# Patient Record
Sex: Male | Born: 1965 | Race: White | Hispanic: No | Marital: Married | Smoking: Current every day smoker
Health system: Southern US, Community
[De-identification: ages and names within clinical notes are randomized; demographics above are authoritative.]

## PROBLEM LIST (undated history)

## (undated) DIAGNOSIS — G4730 Sleep apnea, unspecified: Secondary | ICD-10-CM

## (undated) DIAGNOSIS — E119 Type 2 diabetes mellitus without complications: Secondary | ICD-10-CM

## (undated) DIAGNOSIS — T7840XA Allergy, unspecified, initial encounter: Secondary | ICD-10-CM

## (undated) DIAGNOSIS — I10 Essential (primary) hypertension: Secondary | ICD-10-CM

## (undated) DIAGNOSIS — M1990 Unspecified osteoarthritis, unspecified site: Secondary | ICD-10-CM

## (undated) DIAGNOSIS — E785 Hyperlipidemia, unspecified: Secondary | ICD-10-CM

## (undated) DIAGNOSIS — J45909 Unspecified asthma, uncomplicated: Secondary | ICD-10-CM

## (undated) HISTORY — DX: Allergy, unspecified, initial encounter: T78.40XA

## (undated) HISTORY — DX: Essential (primary) hypertension: I10

## (undated) HISTORY — PX: WISDOM TOOTH EXTRACTION: SHX21

## (undated) HISTORY — DX: Hyperlipidemia, unspecified: E78.5

## (undated) HISTORY — DX: Unspecified asthma, uncomplicated: J45.909

---

## 2015-05-25 ENCOUNTER — Encounter: Payer: Self-pay | Admitting: Family Medicine

## 2015-05-25 ENCOUNTER — Ambulatory Visit (INDEPENDENT_AMBULATORY_CARE_PROVIDER_SITE_OTHER): Payer: Worker's Compensation | Admitting: Family Medicine

## 2015-05-25 ENCOUNTER — Ambulatory Visit: Payer: Worker's Compensation

## 2015-05-25 VITALS — BP 138/82 | HR 115 | Temp 98.3°F | Resp 16 | Ht 74.0 in | Wt 366.0 lb

## 2015-05-25 DIAGNOSIS — M25572 Pain in left ankle and joints of left foot: Secondary | ICD-10-CM

## 2015-05-25 DIAGNOSIS — S86012A Strain of left Achilles tendon, initial encounter: Secondary | ICD-10-CM | POA: Diagnosis not present

## 2015-05-25 DIAGNOSIS — Y990 Civilian activity done for income or pay: Secondary | ICD-10-CM | POA: Diagnosis not present

## 2015-05-25 MED ORDER — IBUPROFEN 800 MG PO TABS: 800.0000 mg | ORAL_TABLET | Freq: Three times a day (TID) | ORAL | Status: DC

## 2015-05-25 NOTE — Patient Instructions (Signed)
Elevate foot as much as possible  Ice several times daily for the next few days  Ibuprofen 800 mg 3 times daily as needed for pain and inflammation  Wear the boot  Referral is being made to orthopedics. If for some reason you have not gotten in by this weekend please plan to return for a recheck in 5 days

## 2015-05-25 NOTE — Progress Notes (Signed)
Patient ID: [name] [name], male    DOB: 25-May-1966  Age: 49 y.o. MRN: [id number]  Chief Complaint  Patient presents with  . Ankle Pain    Left-Twisted getting out of ambulance today    Subjective:   Patient was at work today, with [employer]. 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 He almost fell but caught himself, twisting his ankle in the process. That was about 8:30 this morning. He lays his boot tightly and continue to work all day. It hurts to bear weight. Although he has had a lot of various injuries and pains in the past, his ankle has been fine and this is a new injury.  Current allergies, medications, problem list, past/family and social histories reviewed.  Objective:  BP 138/82 mmHg  Pulse 115  Temp(Src) 98.3 F (36.8 C) (Oral)  Resp 16  Ht  (1.88 m)  Wt 366 lb (166.017 kg)  BMI 46.97 kg/m2  SpO2 98%  Obese man in no major distress. A little bit of swelling visible on the lateral aspect of the left ankle. No ecchymosis or erythema. He is tender, especially along the Achilles tendon. I cannot feel a definite notch in the tendon. He can plantar flex with moderate pain, but it is severely tender to touch. He has a little tenderness anteriorly but more just anterior to the left lateral malleolus. Some generalized tenderness of the whole ankle joint. Very painful when he tries to walk.  Assessment & Plan:   Assessment: 1. Left ankle pain   2. Strain of Achilles tendon, left, initial encounter   3. Work related injury       Plan: X-ray ankle.  Orders Placed This Encounter  Procedures  . DG Ankle Complete Left    Order Specific Question:  Reason for Exam (SYMPTOM  OR DIAGNOSIS REQUIRED)    Answer:  work injury left ankle and achilles pain    Order Specific Question:  Preferred imaging location?    Answer:  External  . Ambulatory referral to Orthopedic Surgery    Referral Priority:  Urgent   Referral Type:  Surgical    Referral Reason:  Specialty Services Required    Requested Specialty:  Orthopedic Surgery    Number of Visits Requested:  1    Meds ordered this encounter  Medications  . quinapril (ACCUPRIL) 40 MG tablet    Sig: Take 40 mg by mouth daily.  . ropinirole (REQUIP) 5 MG tablet    Sig: Take 5 mg by mouth at bedtime.  . Atorvastatin Calcium (LIPITOR PO)    Sig: Take 1 tablet by mouth daily.  . folic acid (FOLVITE) 1 MG tablet    Sig: Take 1 mg by mouth daily.  . Citalopram Hydrobromide (CELEXA PO)    Sig: Take 1 tablet by mouth daily.  Marland Kitchen albuterol (PROVENTIL HFA;VENTOLIN HFA) 108 (90 BASE) MCG/ACT inhaler    Sig: Inhale 1-2 puffs into the lungs every 6 (six) hours as needed for wheezing or shortness of breath.  . montelukast (SINGULAIR) 10 MG tablet    Sig: Take 10 mg by mouth at bedtime.  Marland Kitchen HYDROCHLOROTHIAZIDE PO    Sig: Take 1 tablet by mouth daily.  . Carvedilol (COREG PO)    Sig: Take 1 tablet by mouth daily.  Marland Kitchen ibuprofen (ADVIL,MOTRIN) 800 MG tablet    Sig: Take 1 tablet (800 mg total) by mouth 3 (three) times daily.  Dispense:  30 tablet    Refill:  0    [facility] reading (PRIMARY) by  Dr. Alwyn Ren Normal x-ray except for some sesamoid bones which look a little prominent.  Will refer to orthopedics. This is either a strain or partial tear of the Achilles..       Patient Instructions  Elevate foot as much as possible  Ice several times daily for the next few days  Ibuprofen 800 mg 3 times daily as needed for pain and inflammation  Wear the boot  Referral is being made to orthopedics. If for some reason you have not gotten in by this weekend please plan to return for a recheck in 5 days     Return in about 5 days (around 05/30/2015).   Arther Heisler, MD 05/25/2015

## 2015-05-28 ENCOUNTER — Telehealth: Payer: Self-pay | Admitting: Radiology

## 2015-05-28 NOTE — Telephone Encounter (Signed)
Spoke with patient and he is unable to come pick up ultram rx as he lives an hour away and says that he will just wait for is ortho appt tomorrow.

## 2015-05-28 NOTE — Telephone Encounter (Signed)
[facility] called [name]([name], he is to see ortho tomorrow, but patient c/o severe pain and has not slept at all, wants to know if we can give him something to help with the pain Ibuprofen not helping

## 2018-03-14 ENCOUNTER — Ambulatory Visit (INDEPENDENT_AMBULATORY_CARE_PROVIDER_SITE_OTHER): Payer: Self-pay

## 2018-03-14 ENCOUNTER — Ambulatory Visit (INDEPENDENT_AMBULATORY_CARE_PROVIDER_SITE_OTHER): Payer: 59 | Admitting: Orthopaedic Surgery

## 2018-03-14 ENCOUNTER — Encounter (INDEPENDENT_AMBULATORY_CARE_PROVIDER_SITE_OTHER): Payer: Self-pay | Admitting: Orthopaedic Surgery

## 2018-03-14 DIAGNOSIS — M1611 Unilateral primary osteoarthritis, right hip: Secondary | ICD-10-CM | POA: Diagnosis not present

## 2018-03-14 DIAGNOSIS — M25551 Pain in right hip: Secondary | ICD-10-CM

## 2018-03-14 NOTE — Progress Notes (Signed)
Office Visit Note   Patient: [name] [name]           Date of Birth: October 23, 1965           MRN: [id number] Visit Date: 03/14/2018              Requested by: Derald Macleod, MD [address] [place], [place] [address] PCP: Patient, No Pcp Per   Assessment & Plan: Visit Diagnoses:  1. Pain in right hip   2. Unilateral primary osteoarthritis, right hip     Plan: The patient does understand that he has significant arthritis of his right hip.  I do feel is warranted to try an intra-articular steroid injection in that hip as does he.  He would like to consider hip replacement surgery but after the first the year.  He has never had any type of injection.  He understands that blood glucose control is important.  He is a smoker as well.  We talked about hip replacement surgery and I showed him a hip model explained in detail what this is surgery involves.  I gave a handout about it as well.  All question concerns were answered and addressed.  We will set him up for an intra-articular steroid injection of the right hip under direct fluoroscopy by Dr. Alvester Morin.  He is interested in this as well.  He understands that he needs to watch his blood glucose closely.  I will see him back in 3 months to see how he is doing overall.  If there is any issues before then he will let us know.  At the next visit I do not need x-rays.  Follow-Up Instructions: Return in about 3 months (around 06/14/2018).   Orders:  Orders Placed This Encounter  Procedures  . XR HIP UNILAT W OR W/O PELVIS 1V RIGHT   No orders of the defined types were placed in this encounter.     Procedures: No procedures performed   Clinical Data: No additional findings.   Subjective: Chief Complaint  Patient presents with  . Right Hip - Pain  Patient is a very pleasant 52 year old gentleman who comes in for evaluation treatment of worsening right hip pain.  He hurts in the groin area for about a year and a half now.  It hurts with  pivoting activities and hurts with putting his shoe and sock on crossing his right leg.  He denies any left hip pain.  Denies any back issues.  He does work in emergency services as a first Photographer.  He is a diabetic but reports good control.  He is recently had labs drawn.  He does not know his hemoglobin A1c.  He reports his daily blood glucose stays below 100 most the time.  He denies any specific injury to his right hip.  He has a older brother who has had multiple joint replacements who is not obese.  He does have a body mass index of 47.  He has lost a lot of weight over time he states.  He still weighs over 300 pounds.  HPI  Review of Systems He currently denies any headache, chest pain, shortness of breath, fever, chills, nausea, vomiting.  Objective: Vital Signs: There were no vitals taken for this visit.  Physical Exam He is alert and oriented x3 in no acute distress.  He walks with a limp.  Examination of the right hip shows significant pain with attempts of internal and external rotation of the right hip with no blocks  to rotation.  His left hip exam is entirely normal.  He does have bilateral knee patellofemoral crepitation. Ortho Exam I did lay him down in the supine position on the exam table so I can get a good idea of what and approach to his hip would be from a surgical standpoint.  I could mobilize his abdomen and find a soft tissue plane to get to his right hip.  His leg lengths were equal. Specialty Comments:  No specialty comments available.  Imaging: Xr Hip Unilat W Or W/o Pelvis 1v Right  Result Date: 03/14/2018 An AP pelvis and lateral the right hip shows significant joint space narrowing of the right hip.  There is  significant degenerative changes in the right hip on plain film.    PMFS History: Patient Active Problem List   Diagnosis Date Noted  . Unilateral primary osteoarthritis, right hip 03/14/2018   Past Medical History:  Diagnosis Date  . Allergy     . Asthma   . Hyperlipidemia   . Hypertension     Family History  Problem Relation Age of Onset  . Heart disease Father   . Hyperlipidemia Father   . Hypertension Father   . Heart disease Brother   . Cancer Sister     History reviewed. No pertinent surgical history. Social History   Occupational History  . Not on file  Tobacco Use  . Smoking status: Current Every Day Smoker    Packs/day: 1.00    Types: Cigarettes  . Smokeless tobacco: Never Used  Substance and Sexual Activity  . Alcohol use: Yes    Alcohol/week: 3.6 oz    Types: 6 Standard drinks or equivalent per week  . Drug use: No  . Sexual activity: Not on file

## 2018-03-16 ENCOUNTER — Other Ambulatory Visit (INDEPENDENT_AMBULATORY_CARE_PROVIDER_SITE_OTHER): Payer: Self-pay

## 2018-03-16 DIAGNOSIS — M25551 Pain in right hip: Secondary | ICD-10-CM

## 2018-04-11 ENCOUNTER — Encounter (INDEPENDENT_AMBULATORY_CARE_PROVIDER_SITE_OTHER): Payer: Self-pay | Admitting: Physical Medicine and Rehabilitation

## 2018-04-11 ENCOUNTER — Ambulatory Visit (INDEPENDENT_AMBULATORY_CARE_PROVIDER_SITE_OTHER): Payer: 59 | Admitting: Physical Medicine and Rehabilitation

## 2018-04-11 ENCOUNTER — Ambulatory Visit (INDEPENDENT_AMBULATORY_CARE_PROVIDER_SITE_OTHER): Payer: Self-pay

## 2018-04-11 DIAGNOSIS — M25551 Pain in right hip: Secondary | ICD-10-CM | POA: Diagnosis not present

## 2018-04-11 NOTE — Progress Notes (Signed)
   Valetta FullerJohn [name] - 52 y.o. male MRN [id number]  Date of birth: [birth year]  Office Visit Note: Visit Date: 04/11/2018 PCP: Patient, No Pcp Per Referred by: No ref. provider found  Subjective: Chief Complaint  Patient presents with  . Right Hip - Pain   HPI: Mr. [name] [name] is a 52 year old gentleman with approximately 1-1/2 years of increasing right hip and groin pain.  He rates his pain as a 6 out of 10.  He comes in today at the request of Dr. Doneen Poissonhristopher Blackman for diagnostic and hopefully therapeutic anesthetic hip arthrogram.   ROS Otherwise per HPI.  Assessment & Plan: Visit Diagnoses:  1. Pain in right hip     Plan: Findings:  Diagnostic note for therapeutic anesthetic hip arthrogram on the right.  Patient did get relief during the anesthetic phase.  Significant osteoarthritis seen on arthrogram.    Meds & Orders: No orders of the defined types were placed in this encounter.   Orders Placed This Encounter  Procedures  . Large Joint Inj: R hip joint  . XR C-ARM NO REPORT    Follow-up: Return if symptoms worsen or fail to improve.   Procedures: Large Joint Inj: R hip joint on 04/11/2018 10:46 AM Indications: pain and diagnostic evaluation Details: 22 G needle, anterior approach  Arthrogram: Yes  Medications: 80 mg triamcinolone acetonide 40 MG/ML; 3 mL bupivacaine 0.5 % Outcome: tolerated well, no immediate complications  Arthrogram demonstrated excellent flow of contrast throughout the joint surface without extravasation or obvious defect.  The patient had relief of symptoms during the anesthetic phase of the injection.  Procedure, treatment alternatives, risks and benefits explained, specific risks discussed. Consent was given by the patient. Immediately prior to procedure a time out was called to verify the correct patient, procedure, equipment, support staff and site/side marked as required. Patient was prepped and draped in the usual sterile fashion.      No  notes on file   Clinical History: No specialty comments available.   He reports that he has been smoking cigarettes. He has been smoking about 1.00 pack per day. He has never used smokeless tobacco. No results for input(s): HGBA1C, LABURIC in the last 8760 hours.  Objective:  VS:  HT:    WT:   BMI:     BP:   HR: bpm  TEMP: ( )  RESP:  Physical Exam  Ortho Exam Imaging: No results found.  Past Medical/Family/Surgical/Social History: Medications & Allergies reviewed per EMR, new medications updated. Patient Active Problem List   Diagnosis Date Noted  . Unilateral primary osteoarthritis, right hip 03/14/2018   Past Medical History:  Diagnosis Date  . Allergy   . Asthma   . Hyperlipidemia   . Hypertension    Family History  Problem Relation Age of Onset  . Heart disease Father   . Hyperlipidemia Father   . Hypertension Father   . Heart disease Brother   . Cancer Sister    History reviewed. No pertinent surgical history. Social History   Occupational History  . Not on file  Tobacco Use  . Smoking status: Current Every Day Smoker    Packs/day: 1.00    Types: Cigarettes  . Smokeless tobacco: Never Used  Substance and Sexual Activity  . Alcohol use: Yes    Alcohol/week: 6.0 standard drinks    Types: 6 Standard drinks or equivalent per week  . Drug use: No  . Sexual activity: Not on file

## 2018-04-11 NOTE — Progress Notes (Signed)
  Numeric Pain Rating Scale and Functional Assessment Average Pain 6   In the last MONTH (on 0-10 scale) has pain interfered with the following?  1. General activity like being  able to carry out your everyday physical activities such as walking, climbing stairs, carrying groceries, or moving a chair?  Rating(9)    -Dye Allergies.

## 2018-04-11 NOTE — Patient Instructions (Signed)

## 2018-04-24 MED ORDER — BUPIVACAINE HCL 0.5 % IJ SOLN
3.0000 mL | INTRAMUSCULAR | Status: AC | PRN
  Administered 2018-04-11: 3 mL via INTRA_ARTICULAR

## 2018-04-24 MED ORDER — TRIAMCINOLONE ACETONIDE 40 MG/ML IJ SUSP
80.0000 mg | INTRAMUSCULAR | Status: AC | PRN
  Administered 2018-04-11: 80 mg via INTRA_ARTICULAR

## 2018-06-11 ENCOUNTER — Ambulatory Visit (INDEPENDENT_AMBULATORY_CARE_PROVIDER_SITE_OTHER): Payer: 59 | Admitting: Orthopaedic Surgery

## 2018-06-11 ENCOUNTER — Encounter (INDEPENDENT_AMBULATORY_CARE_PROVIDER_SITE_OTHER): Payer: Self-pay | Admitting: Orthopaedic Surgery

## 2018-06-11 DIAGNOSIS — M1611 Unilateral primary osteoarthritis, right hip: Secondary | ICD-10-CM

## 2018-06-11 DIAGNOSIS — M25551 Pain in right hip: Secondary | ICD-10-CM | POA: Diagnosis not present

## 2018-06-11 NOTE — Progress Notes (Signed)
Patient is a morbidly obese 52 year old gentleman with diabetes well-known to me.  He is blood glucose is been under excellent control with his last A1c 6.  He does have a BMI of 47.  He has severe arthritis involving his right hip.  He had an intra-articular injection recently and that helped temporize his pain.  He still walks with a significant limp.  On exam he still has severe pain with internal extra rotation of the right hip with x-rays that are well-documented showing the severe arthritis in his right hip.  I did lay him in a supine position on the exam table and he does have a large abdomen that I can mobilize along his thigh but I feel comfortable from a surgical standpoint getting to his hip.  A long thorough discussion about hip ablation surgery again like we did at his last visit.  He understands the difficulty of the surgery given his obesity and the high risk of blood loss fracture dislocation and especially implant failure.  He is willing to take these under consideration.  I am willing to perform this surgery in spite of the risks.  He would like to have this set up for sometime the second week of January.  All question concerns were answered and addressed.  He understands if he has questionable pulling to not hesitate to give me a call.

## 2018-07-26 ENCOUNTER — Other Ambulatory Visit (INDEPENDENT_AMBULATORY_CARE_PROVIDER_SITE_OTHER): Payer: Self-pay

## 2018-07-30 ENCOUNTER — Other Ambulatory Visit (INDEPENDENT_AMBULATORY_CARE_PROVIDER_SITE_OTHER): Payer: Self-pay | Admitting: Physician Assistant

## 2018-07-31 NOTE — Pre-Procedure Instructions (Signed)
Valetta FullerJohn Heaslip  07/31/2018      [id number] - [place], [place] - [address] [place] [id number] Phone: [phone] Fax: [phone]    Your procedure is scheduled on December 24  [facility] at 0745 A.M.  Call this number if you have problems the morning of surgery:  [phone]   Remember:  Do not eat or drink after midnight.      Take these medicines the morning of surgery with A SIP OF WATER  acetaminophen (TYLENOL)  albuterol (PROVENTIL HFA;VENTOLIN HFA)  carvedilol (COREG) montelukast (SINGULAIR)  7 days prior to surgery STOP taking any Aspirin(unless otherwise instructed by your surgeon), Aleve, Naproxen, Ibuprofen, Motrin, Advil, Goody's, BC's, all herbal medications, fish oil, and all vitamins   WHAT DO I DO ABOUT MY DIABETES MEDICATION?   Marland Kitchen. Do not take oral diabetes medicines (pills) the morning of surgery. metFORMIN (GLUCOPHAGE-XR)   How to Manage Your Diabetes Before and After Surgery  Why is it important to control my blood sugar before and after surgery? . Improving blood sugar levels before and after surgery helps healing and can limit problems. . A way of improving blood sugar control is eating a healthy diet by: o  Eating less sugar and carbohydrates o  Increasing activity/exercise o  Talking with your doctor about reaching your blood sugar goals . High blood sugars (greater than 180 mg/dL) can raise your risk of infections and slow your recovery, so you will need to focus on controlling your diabetes during the weeks before surgery. . Make sure that the doctor who takes care of your diabetes knows about your planned surgery including the date and location.  How do I manage my blood sugar before surgery? . Check your blood sugar at least 4 times a day, starting 2 days before surgery, to make sure that the level is not too high or low. o Check your blood sugar the morning of your surgery  when you wake up and every 2 hours until you get to the Short Stay unit. . If your blood sugar is less than 70 mg/dL, you will need to treat for low blood sugar: o Do not take insulin. o Treat a low blood sugar (less than 70 mg/dL) with  cup of clear juice (cranberry or apple), 4 glucose tablets, OR glucose gel. o Recheck blood sugar in 15 minutes after treatment (to make sure it is greater than 70 mg/dL). If your blood sugar is not greater than 70 mg/dL on recheck, call [phone] for further instructions. . Report your blood sugar to the short stay nurse when you get to Short Stay.  . If you are admitted to the hospital after surgery: o Your blood sugar will be checked by the staff and you will probably be given insulin after surgery (instead of oral diabetes medicines) to make sure you have good blood sugar levels. o The goal for blood sugar control after surgery is 80-180 mg/dL.     Do not wear jewelry  Do not wear lotions, powders, or cologne, or deodorant.  Men may shave face and neck.  Do not bring valuables to the hospital.  [facility] is not responsible for any belongings or valuables.  Contacts, dentures or bridgework may not be worn into surgery.  Leave your suitcase in the car.  After surgery it may be brought to your room.  For patients admitted to the hospital, discharge time  will be determined by your treatment team.  Patients discharged the day of surgery will not be allowed to drive home.    Special instructions:   [place]- Preparing For Surgery  Before surgery, you can play an important role. Because skin is not sterile, your skin needs to be as free of germs as possible. You can reduce the number of germs on your skin by washing with CHG (chlorahexidine gluconate) Soap before surgery.  CHG is an antiseptic cleaner which kills germs and bonds with the skin to continue killing germs even after washing.    Oral Hygiene is also important to reduce your risk of  infection.  Remember - BRUSH YOUR TEETH THE MORNING OF SURGERY WITH YOUR REGULAR TOOTHPASTE  Please do not use if you have an allergy to CHG or antibacterial soaps. If your skin becomes reddened/irritated stop using the CHG.  Do not shave (including legs and underarms) for at least 48 hours prior to first CHG shower. It is OK to shave your face.  Please follow these instructions carefully.   1. Shower the NIGHT BEFORE SURGERY and the MORNING OF SURGERY with CHG.   2. If you chose to wash your hair, wash your hair first as usual with your normal shampoo.  3. After you shampoo, rinse your hair and body thoroughly to remove the shampoo.  4. Use CHG as you would any other liquid soap. You can apply CHG directly to the skin and wash gently with a scrungie or a clean washcloth.   5. Apply the CHG Soap to your body ONLY FROM THE NECK DOWN.  Do not use on open wounds or open sores. Avoid contact with your eyes, ears, mouth and genitals (private parts). Wash Face and genitals (private parts)  with your normal soap.  6. Wash thoroughly, paying special attention to the area where your surgery will be performed.  7. Thoroughly rinse your body with warm water from the neck down.  8. DO NOT shower/wash with your normal soap after using and rinsing off the CHG Soap.  9. Pat yourself dry with a CLEAN TOWEL.  10. Wear CLEAN PAJAMAS to bed the night before surgery, wear comfortable clothes the morning of surgery  11. Place CLEAN SHEETS on your bed the night of your first shower and DO NOT SLEEP WITH PETS.    Day of Surgery:  Do not apply any deodorants/lotions.  Please wear clean clothes to the hospital/surgery center.   Remember to brush your teeth WITH YOUR REGULAR TOOTHPASTE.    Please read over the following fact sheets that you were given.

## 2018-08-01 ENCOUNTER — Encounter (HOSPITAL_COMMUNITY): Payer: Self-pay

## 2018-08-01 ENCOUNTER — Encounter (HOSPITAL_COMMUNITY)
Admission: RE | Admit: 2018-08-01 | Discharge: 2018-08-01 | Disposition: A | Discharge status: Home / Self Care | Payer: 59 | Source: Ambulatory Visit | Attending: Orthopaedic Surgery | Admitting: Orthopaedic Surgery

## 2018-08-01 ENCOUNTER — Other Ambulatory Visit: Payer: Self-pay

## 2018-08-01 DIAGNOSIS — Z01818 Encounter for other preprocedural examination: Secondary | ICD-10-CM | POA: Diagnosis present

## 2018-08-01 DIAGNOSIS — I10 Essential (primary) hypertension: Secondary | ICD-10-CM | POA: Insufficient documentation

## 2018-08-01 HISTORY — DX: Type 2 diabetes mellitus without complications: E11.9

## 2018-08-01 HISTORY — DX: Sleep apnea, unspecified: G47.30

## 2018-08-01 LAB — COMPREHENSIVE METABOLIC PANEL
ALT: 41 U/L (ref 0–44)
AST: 33 U/L (ref 15–41)
Albumin: 4.2 g/dL (ref 3.5–5.0)
Alkaline Phosphatase: 50 U/L (ref 38–126)
Anion gap: 14 (ref 5–15)
BUN: 16 mg/dL (ref 6–20)
CHLORIDE: 103 mmol/L (ref 98–111)
CO2: 21 mmol/L — ABNORMAL LOW (ref 22–32)
Calcium: 9.6 mg/dL (ref 8.9–10.3)
Creatinine, Ser: 0.88 mg/dL (ref 0.61–1.24)
GFR calc Af Amer: >60 mL/min (ref >60)
Glucose, Bld: 101 mg/dL — ABNORMAL HIGH (ref 70–99)
Potassium: 4.2 mmol/L (ref 3.5–5.1)
Sodium: 138 mmol/L (ref 135–145)
Total Bilirubin: 0.7 mg/dL (ref 0.3–1.2)
Total Protein: 7.9 g/dL (ref 6.5–8.1)

## 2018-08-01 LAB — CBC
HCT: 45.8 % (ref 39.0–52.0)
Hemoglobin: 15.6 g/dL (ref 13.0–17.0)
MCH: 34.4 pg — AB (ref 26.0–34.0)
MCHC: 34.1 g/dL (ref 30.0–36.0)
MCV: 100.9 fL — AB (ref 80.0–100.0)
NRBC: 0 % (ref 0.0–0.2)
Platelets: 198 10*3/uL (ref 150–400)
RBC: 4.54 MIL/uL (ref 4.22–5.81)
RDW: 12.9 % (ref 11.5–15.5)
WBC: 10.3 10*3/uL (ref 4.0–10.5)

## 2018-08-01 LAB — GLUCOSE, CAPILLARY: Glucose-Capillary: 104 mg/dL — ABNORMAL HIGH (ref 70–99)

## 2018-08-01 NOTE — Progress Notes (Signed)
[facility] - saw one over a year ago but doesn't remember the name or where he went  Chest x-ray - N/A EKG - 08/01/18 Stress Test - 2017 ECHO - 2016  Sleep Study - ~ 2013 CPAP - will bring mask and hose  Fasting Blood Sugar - low 100s Checks Blood Sugar 2-3 times per week  Blood Thinner Instructions: N/A Aspirin Instructions: last dose 12/18  Anesthesia review: none  Patient denies shortness of breath, fever, cough and chest pain at PAT appointment   Patient verbalized understanding of instructions that were given to them at the PAT appointment. Patient was also instructed that they will need to review over the PAT instructions again at home before surgery.

## 2018-08-06 MED ORDER — DEXTROSE 5 % IV SOLN
3.0000 g | INTRAVENOUS | Status: AC
  Administered 2018-08-07: 3 g via INTRAVENOUS
  Filled 2018-08-06: qty 3

## 2018-08-06 MED ORDER — TRANEXAMIC ACID-NACL 1000-0.7 MG/100ML-% IV SOLN
1000.0000 mg | INTRAVENOUS | Status: AC
  Administered 2018-08-07: 1000 mg via INTRAVENOUS
  Filled 2018-08-06: qty 100

## 2018-08-07 ENCOUNTER — Encounter (HOSPITAL_COMMUNITY): Payer: Self-pay | Admitting: Registered Nurse

## 2018-08-07 ENCOUNTER — Inpatient Hospital Stay (HOSPITAL_COMMUNITY): Payer: 59 | Admitting: Registered Nurse

## 2018-08-07 ENCOUNTER — Inpatient Hospital Stay (HOSPITAL_COMMUNITY): Payer: 59

## 2018-08-07 ENCOUNTER — Encounter (HOSPITAL_COMMUNITY)
Admission: RE | Disposition: A | Discharge status: Home Health | Payer: Self-pay | Source: Home / Self Care | Attending: Orthopaedic Surgery

## 2018-08-07 ENCOUNTER — Other Ambulatory Visit: Payer: Self-pay

## 2018-08-07 ENCOUNTER — Inpatient Hospital Stay (HOSPITAL_COMMUNITY)
Admission: RE | Admit: 2018-08-07 | Discharge: 2018-08-08 | DRG: 470 | Disposition: A | Discharge status: Home Health | Payer: 59 | Attending: Orthopaedic Surgery | Admitting: Orthopaedic Surgery

## 2018-08-07 DIAGNOSIS — Z7982 Long term (current) use of aspirin: Secondary | ICD-10-CM | POA: Diagnosis not present

## 2018-08-07 DIAGNOSIS — M1611 Unilateral primary osteoarthritis, right hip: Secondary | ICD-10-CM | POA: Diagnosis present

## 2018-08-07 DIAGNOSIS — Z8249 Family history of ischemic heart disease and other diseases of the circulatory system: Secondary | ICD-10-CM

## 2018-08-07 DIAGNOSIS — Z79899 Other long term (current) drug therapy: Secondary | ICD-10-CM | POA: Diagnosis not present

## 2018-08-07 DIAGNOSIS — Z885 Allergy status to narcotic agent status: Secondary | ICD-10-CM

## 2018-08-07 DIAGNOSIS — F17210 Nicotine dependence, cigarettes, uncomplicated: Secondary | ICD-10-CM | POA: Diagnosis present

## 2018-08-07 DIAGNOSIS — Z8349 Family history of other endocrine, nutritional and metabolic diseases: Secondary | ICD-10-CM | POA: Diagnosis not present

## 2018-08-07 DIAGNOSIS — Z7984 Long term (current) use of oral hypoglycemic drugs: Secondary | ICD-10-CM | POA: Diagnosis not present

## 2018-08-07 DIAGNOSIS — Z419 Encounter for procedure for purposes other than remedying health state, unspecified: Secondary | ICD-10-CM

## 2018-08-07 DIAGNOSIS — E119 Type 2 diabetes mellitus without complications: Secondary | ICD-10-CM | POA: Diagnosis present

## 2018-08-07 DIAGNOSIS — G4730 Sleep apnea, unspecified: Secondary | ICD-10-CM | POA: Diagnosis present

## 2018-08-07 DIAGNOSIS — Z6841 Body Mass Index (BMI) 40.0 and over, adult: Secondary | ICD-10-CM | POA: Diagnosis not present

## 2018-08-07 DIAGNOSIS — E785 Hyperlipidemia, unspecified: Secondary | ICD-10-CM | POA: Diagnosis present

## 2018-08-07 DIAGNOSIS — Z96642 Presence of left artificial hip joint: Secondary | ICD-10-CM

## 2018-08-07 DIAGNOSIS — Z888 Allergy status to other drugs, medicaments and biological substances status: Secondary | ICD-10-CM

## 2018-08-07 DIAGNOSIS — I10 Essential (primary) hypertension: Secondary | ICD-10-CM | POA: Diagnosis present

## 2018-08-07 DIAGNOSIS — Z96641 Presence of right artificial hip joint: Secondary | ICD-10-CM

## 2018-08-07 HISTORY — PX: TOTAL HIP ARTHROPLASTY: SHX124

## 2018-08-07 LAB — GLUCOSE, CAPILLARY
Glucose-Capillary: 116 mg/dL — ABNORMAL HIGH (ref 70–99)
Glucose-Capillary: 104 mg/dL — ABNORMAL HIGH (ref 70–99)
Glucose-Capillary: 338 mg/dL — ABNORMAL HIGH (ref 70–99)
Glucose-Capillary: 165 mg/dL — ABNORMAL HIGH (ref 70–99)

## 2018-08-07 SURGERY — ARTHROPLASTY, HIP, TOTAL, ANTERIOR APPROACH
Anesthesia: Spinal | Site: Hip | Laterality: Right

## 2018-08-07 MED ORDER — ONDANSETRON HCL 4 MG PO TABS: 4.0000 mg | ORAL_TABLET | Freq: Four times a day (QID) | ORAL | Status: DC | PRN

## 2018-08-07 MED ORDER — METFORMIN HCL ER 500 MG PO TB24
1000.0000 mg | ORAL_TABLET | Freq: Two times a day (BID) | ORAL | Status: DC
  Administered 2018-08-07 – 2018-08-08 (×2): 1000 mg via ORAL
  Filled 2018-08-07 (×2): qty 2

## 2018-08-07 MED ORDER — PROPOFOL 1000 MG/100ML IV EMUL
INTRAVENOUS | Status: AC
  Filled 2018-08-07: qty 100

## 2018-08-07 MED ORDER — CARVEDILOL 12.5 MG PO TABS
12.5000 mg | ORAL_TABLET | Freq: Two times a day (BID) | ORAL | Status: DC
  Administered 2018-08-07 – 2018-08-08 (×2): 12.5 mg via ORAL
  Filled 2018-08-07 (×2): qty 1

## 2018-08-07 MED ORDER — ALBUTEROL SULFATE (2.5 MG/3ML) 0.083% IN NEBU: 2.5000 mg | INHALATION_SOLUTION | Freq: Four times a day (QID) | RESPIRATORY_TRACT | Status: DC | PRN

## 2018-08-07 MED ORDER — SODIUM CHLORIDE 0.9 % IR SOLN
Status: DC | PRN
  Administered 2018-08-07: 3000 mL

## 2018-08-07 MED ORDER — OXYCODONE HCL 5 MG PO TABS: 5.0000 mg | ORAL_TABLET | ORAL | Status: DC | PRN

## 2018-08-07 MED ORDER — ASPIRIN 81 MG PO CHEW
81.0000 mg | CHEWABLE_TABLET | Freq: Two times a day (BID) | ORAL | Status: DC
  Administered 2018-08-07 – 2018-08-08 (×2): 81 mg via ORAL
  Filled 2018-08-07 (×2): qty 1

## 2018-08-07 MED ORDER — PHENOL 1.4 % MT LIQD: 1.0000 | OROMUCOSAL | Status: DC | PRN

## 2018-08-07 MED ORDER — MIDAZOLAM HCL 2 MG/2ML IJ SOLN
INTRAMUSCULAR | Status: AC
  Filled 2018-08-07: qty 2

## 2018-08-07 MED ORDER — ALBUTEROL SULFATE HFA 108 (90 BASE) MCG/ACT IN AERS: 1.0000 | INHALATION_SPRAY | Freq: Four times a day (QID) | RESPIRATORY_TRACT | Status: DC | PRN

## 2018-08-07 MED ORDER — METOCLOPRAMIDE HCL 5 MG PO TABS: 5.0000 mg | ORAL_TABLET | Freq: Three times a day (TID) | ORAL | Status: DC | PRN

## 2018-08-07 MED ORDER — DEXAMETHASONE SODIUM PHOSPHATE 10 MG/ML IJ SOLN
INTRAMUSCULAR | Status: AC
  Filled 2018-08-07: qty 1

## 2018-08-07 MED ORDER — KETOROLAC TROMETHAMINE 15 MG/ML IJ SOLN
15.0000 mg | Freq: Four times a day (QID) | INTRAMUSCULAR | Status: DC
  Administered 2018-08-07 – 2018-08-08 (×3): 15 mg via INTRAVENOUS
  Filled 2018-08-07 (×4): qty 1

## 2018-08-07 MED ORDER — DIPHENHYDRAMINE HCL 12.5 MG/5ML PO ELIX: 12.5000 mg | ORAL_SOLUTION | ORAL | Status: DC | PRN

## 2018-08-07 MED ORDER — SODIUM CHLORIDE 0.9 % IV SOLN
INTRAVENOUS | Status: DC | PRN
  Administered 2018-08-07: 30 ug/min via INTRAVENOUS

## 2018-08-07 MED ORDER — OXYCODONE HCL 5 MG PO TABS
10.0000 mg | ORAL_TABLET | ORAL | Status: DC | PRN
  Administered 2018-08-07: 10 mg via ORAL
  Administered 2018-08-08: 15 mg via ORAL
  Filled 2018-08-07: qty 3
  Filled 2018-08-07: qty 2

## 2018-08-07 MED ORDER — QUINAPRIL HCL 10 MG PO TABS
20.0000 mg | ORAL_TABLET | Freq: Every day | ORAL | Status: DC
  Administered 2018-08-07 – 2018-08-08 (×2): 20 mg via ORAL
  Filled 2018-08-07 (×2): qty 2

## 2018-08-07 MED ORDER — METOCLOPRAMIDE HCL 5 MG/ML IJ SOLN: 5.0000 mg | Freq: Three times a day (TID) | INTRAMUSCULAR | Status: DC | PRN

## 2018-08-07 MED ORDER — MENTHOL 3 MG MT LOZG: 1.0000 | LOZENGE | OROMUCOSAL | Status: DC | PRN

## 2018-08-07 MED ORDER — LACTATED RINGERS IV SOLN
INTRAVENOUS | Status: DC | PRN
  Administered 2018-08-07 (×2): via INTRAVENOUS

## 2018-08-07 MED ORDER — ALUM & MAG HYDROXIDE-SIMETH 200-200-20 MG/5ML PO SUSP: 30.0000 mL | ORAL | Status: DC | PRN

## 2018-08-07 MED ORDER — DEXAMETHASONE SODIUM PHOSPHATE 10 MG/ML IJ SOLN
INTRAMUSCULAR | Status: DC | PRN
  Administered 2018-08-07: 4 mg via INTRAVENOUS

## 2018-08-07 MED ORDER — PROPOFOL 10 MG/ML IV BOLUS
INTRAVENOUS | Status: DC | PRN
  Administered 2018-08-07 (×2): 20 mg via INTRAVENOUS
  Administered 2018-08-07: 50 mg via INTRAVENOUS

## 2018-08-07 MED ORDER — HYDROMORPHONE HCL 1 MG/ML IJ SOLN: 0.2500 mg | INTRAMUSCULAR | Status: DC | PRN

## 2018-08-07 MED ORDER — INSULIN ASPART 100 UNIT/ML ~~LOC~~ SOLN
0.0000 [IU] | Freq: Three times a day (TID) | SUBCUTANEOUS | Status: DC
  Administered 2018-08-07: 15 [IU] via SUBCUTANEOUS

## 2018-08-07 MED ORDER — 0.9 % SODIUM CHLORIDE (POUR BTL) OPTIME
TOPICAL | Status: DC | PRN
  Administered 2018-08-07: 1000 mL

## 2018-08-07 MED ORDER — HYDROMORPHONE HCL 1 MG/ML IJ SOLN: 0.5000 mg | INTRAMUSCULAR | Status: DC | PRN

## 2018-08-07 MED ORDER — NICOTINE 21 MG/24HR TD PT24
21.0000 mg | MEDICATED_PATCH | Freq: Every day | TRANSDERMAL | Status: DC
  Administered 2018-08-07 – 2018-08-08 (×2): 21 mg via TRANSDERMAL
  Filled 2018-08-07 (×2): qty 1

## 2018-08-07 MED ORDER — ONDANSETRON HCL 4 MG/2ML IJ SOLN: 4.0000 mg | Freq: Four times a day (QID) | INTRAMUSCULAR | Status: DC | PRN

## 2018-08-07 MED ORDER — METHOCARBAMOL 500 MG PO TABS
500.0000 mg | ORAL_TABLET | Freq: Four times a day (QID) | ORAL | Status: DC | PRN
  Administered 2018-08-08: 500 mg via ORAL
  Filled 2018-08-07: qty 1

## 2018-08-07 MED ORDER — EPHEDRINE 5 MG/ML INJ
INTRAVENOUS | Status: AC
  Filled 2018-08-07: qty 10

## 2018-08-07 MED ORDER — INSULIN ASPART 100 UNIT/ML ~~LOC~~ SOLN: 0.0000 [IU] | Freq: Every day | SUBCUTANEOUS | Status: DC

## 2018-08-07 MED ORDER — FENTANYL CITRATE (PF) 250 MCG/5ML IJ SOLN
INTRAMUSCULAR | Status: AC
  Filled 2018-08-07: qty 5

## 2018-08-07 MED ORDER — POLYETHYLENE GLYCOL 3350 17 G PO PACK: 17.0000 g | PACK | Freq: Every day | ORAL | Status: DC | PRN

## 2018-08-07 MED ORDER — PANTOPRAZOLE SODIUM 40 MG PO TBEC
40.0000 mg | DELAYED_RELEASE_TABLET | Freq: Every day | ORAL | Status: DC
  Administered 2018-08-07 – 2018-08-08 (×2): 40 mg via ORAL
  Filled 2018-08-07 (×2): qty 1

## 2018-08-07 MED ORDER — PROPOFOL 500 MG/50ML IV EMUL
INTRAVENOUS | Status: DC | PRN
  Administered 2018-08-07: 100 ug/kg/min via INTRAVENOUS
  Administered 2018-08-07: 11:00:00 via INTRAVENOUS

## 2018-08-07 MED ORDER — ZOLPIDEM TARTRATE 5 MG PO TABS
5.0000 mg | ORAL_TABLET | Freq: Every evening | ORAL | Status: DC | PRN
  Administered 2018-08-07: 5 mg via ORAL
  Filled 2018-08-07: qty 1

## 2018-08-07 MED ORDER — METHOCARBAMOL 1000 MG/10ML IJ SOLN
500.0000 mg | Freq: Four times a day (QID) | INTRAVENOUS | Status: DC | PRN
  Filled 2018-08-07: qty 5

## 2018-08-07 MED ORDER — BUPIVACAINE IN DEXTROSE 0.75-8.25 % IT SOLN
INTRATHECAL | Status: DC | PRN
  Administered 2018-08-07: 2 mL via INTRATHECAL

## 2018-08-07 MED ORDER — CITALOPRAM HYDROBROMIDE 40 MG PO TABS
40.0000 mg | ORAL_TABLET | Freq: Every evening | ORAL | Status: DC
  Administered 2018-08-07: 40 mg via ORAL
  Filled 2018-08-07: qty 1

## 2018-08-07 MED ORDER — ONDANSETRON HCL 4 MG/2ML IJ SOLN
INTRAMUSCULAR | Status: AC
  Filled 2018-08-07: qty 2

## 2018-08-07 MED ORDER — DOCUSATE SODIUM 100 MG PO CAPS
100.0000 mg | ORAL_CAPSULE | Freq: Two times a day (BID) | ORAL | Status: DC
  Administered 2018-08-07 – 2018-08-08 (×3): 100 mg via ORAL
  Filled 2018-08-07 (×3): qty 1

## 2018-08-07 MED ORDER — CEFAZOLIN SODIUM-DEXTROSE 2-4 GM/100ML-% IV SOLN
2.0000 g | Freq: Four times a day (QID) | INTRAVENOUS | Status: AC
  Administered 2018-08-07 (×2): 2 g via INTRAVENOUS
  Filled 2018-08-07 (×2): qty 100

## 2018-08-07 MED ORDER — CHLORHEXIDINE GLUCONATE 4 % EX LIQD: 60.0000 mL | Freq: Once | CUTANEOUS | Status: DC

## 2018-08-07 MED ORDER — MONTELUKAST SODIUM 10 MG PO TABS
10.0000 mg | ORAL_TABLET | Freq: Every day | ORAL | Status: DC
  Administered 2018-08-08: 10 mg via ORAL
  Filled 2018-08-07 (×2): qty 1

## 2018-08-07 MED ORDER — PROMETHAZINE HCL 25 MG/ML IJ SOLN: 6.2500 mg | INTRAMUSCULAR | Status: DC | PRN

## 2018-08-07 MED ORDER — HYDROCHLOROTHIAZIDE 25 MG PO TABS
25.0000 mg | ORAL_TABLET | Freq: Every day | ORAL | Status: DC
  Administered 2018-08-07 – 2018-08-08 (×2): 25 mg via ORAL
  Filled 2018-08-07 (×2): qty 1

## 2018-08-07 MED ORDER — SODIUM CHLORIDE 0.9 % IV SOLN
INTRAVENOUS | Status: DC
  Administered 2018-08-07: 15:00:00 via INTRAVENOUS

## 2018-08-07 MED ORDER — FOLIC ACID 1 MG PO TABS
1.0000 mg | ORAL_TABLET | Freq: Every day | ORAL | Status: DC
  Administered 2018-08-07 – 2018-08-08 (×2): 1 mg via ORAL
  Filled 2018-08-07 (×2): qty 1

## 2018-08-07 MED ORDER — MIDAZOLAM HCL 5 MG/5ML IJ SOLN
INTRAMUSCULAR | Status: DC | PRN
  Administered 2018-08-07: 2 mg via INTRAVENOUS

## 2018-08-07 MED ORDER — PHENYLEPHRINE 40 MCG/ML (10ML) SYRINGE FOR IV PUSH (FOR BLOOD PRESSURE SUPPORT)
PREFILLED_SYRINGE | INTRAVENOUS | Status: AC
  Filled 2018-08-07: qty 10

## 2018-08-07 MED ORDER — PROPOFOL 10 MG/ML IV BOLUS
INTRAVENOUS | Status: AC
  Filled 2018-08-07: qty 20

## 2018-08-07 MED ORDER — FENTANYL CITRATE (PF) 250 MCG/5ML IJ SOLN
INTRAMUSCULAR | Status: DC | PRN
  Administered 2018-08-07 (×2): 50 ug via INTRAVENOUS

## 2018-08-07 MED ORDER — ONDANSETRON HCL 4 MG/2ML IJ SOLN
INTRAMUSCULAR | Status: DC | PRN
  Administered 2018-08-07: 4 mg via INTRAVENOUS

## 2018-08-07 MED ORDER — ACETAMINOPHEN 325 MG PO TABS: 325.0000 mg | ORAL_TABLET | Freq: Four times a day (QID) | ORAL | Status: DC | PRN

## 2018-08-07 MED ORDER — LACTATED RINGERS IV SOLN: INTRAVENOUS | Status: DC

## 2018-08-07 SURGICAL SUPPLY — 54 items
ACETAB CUP W GRIPTION 54MM (Plate) ×1 IMPLANT
ACETAB CUP W/GRIPTION 54 (Plate) ×2 IMPLANT
BENZOIN TINCTURE PRP APPL 2/3 (GAUZE/BANDAGES/DRESSINGS) ×3 IMPLANT
BLADE SAW SGTL 18X1.27X75 (BLADE) ×2 IMPLANT
BLADE SAW SGTL 18X1.27X75MM (BLADE) ×1
COVER SURGICAL LIGHT HANDLE (MISCELLANEOUS) ×3 IMPLANT
CUP ACETAB W/GRIPTION 54 (Plate) ×1 IMPLANT
DRAPE C-ARM 42X72 X-RAY (DRAPES) ×3 IMPLANT
DRAPE STERI IOBAN 125X83 (DRAPES) ×3 IMPLANT
DRAPE U-SHAPE 47X51 STRL (DRAPES) ×9 IMPLANT
DRSG AQUACEL AG ADV 3.5X 6 (GAUZE/BANDAGES/DRESSINGS) ×3 IMPLANT
DRSG AQUACEL AG ADV 3.5X10 (GAUZE/BANDAGES/DRESSINGS) ×3 IMPLANT
DURAPREP 26ML APPLICATOR (WOUND CARE) ×3 IMPLANT
ELECT BLADE 4.0 EZ CLEAN MEGAD (MISCELLANEOUS) ×3
ELECT BLADE 6.5 EXT (BLADE) IMPLANT
ELECT REM PT RETURN 9FT ADLT (ELECTROSURGICAL) ×3
ELECTRODE BLDE 4.0 EZ CLN MEGD (MISCELLANEOUS) ×1 IMPLANT
ELECTRODE REM PT RTRN 9FT ADLT (ELECTROSURGICAL) ×1 IMPLANT
FACESHIELD WRAPAROUND (MASK) ×6 IMPLANT
GAUZE XEROFORM 1X8 LF (GAUZE/BANDAGES/DRESSINGS) ×3 IMPLANT
GLOVE BIOGEL PI IND STRL 8 (GLOVE) ×2 IMPLANT
GLOVE BIOGEL PI INDICATOR 8 (GLOVE) ×4
GLOVE ECLIPSE 7.0 STRL STRAW (GLOVE) ×6 IMPLANT
GLOVE ORTHO TXT STRL SZ7.5 (GLOVE) ×6 IMPLANT
GLOVE SURG SS PI 6.5 STRL IVOR (GLOVE) ×3 IMPLANT
GLOVE SURG SS PI 7.0 STRL IVOR (GLOVE) ×3 IMPLANT
GOWN STRL REUS W/ TWL LRG LVL3 (GOWN DISPOSABLE) ×2 IMPLANT
GOWN STRL REUS W/ TWL XL LVL3 (GOWN DISPOSABLE) ×2 IMPLANT
GOWN STRL REUS W/TWL LRG LVL3 (GOWN DISPOSABLE) ×4
GOWN STRL REUS W/TWL XL LVL3 (GOWN DISPOSABLE) ×4
HANDPIECE INTERPULSE COAX TIP (DISPOSABLE) ×2
HEAD CERAMIC DELTA 36 PLUS 1.5 (Hips) ×3 IMPLANT
KIT BASIN OR (CUSTOM PROCEDURE TRAY) ×3 IMPLANT
KIT TURNOVER KIT B (KITS) ×3 IMPLANT
LINER NEUTRAL 54X36MM PLUS 4 (Hips) ×3 IMPLANT
MANIFOLD NEPTUNE II (INSTRUMENTS) ×3 IMPLANT
NS IRRIG 1000ML POUR BTL (IV SOLUTION) ×3 IMPLANT
PACK TOTAL JOINT (CUSTOM PROCEDURE TRAY) ×3 IMPLANT
PAD ARMBOARD 7.5X6 YLW CONV (MISCELLANEOUS) ×3 IMPLANT
SCREW 6.5MMX25MM (Screw) ×3 IMPLANT
SET HNDPC FAN SPRY TIP SCT (DISPOSABLE) ×1 IMPLANT
STAPLER VISISTAT 35W (STAPLE) IMPLANT
STEM CORAIL KA12 (Stem) ×3 IMPLANT
SUT ETHIBOND NAB CT1 #1 30IN (SUTURE) ×6 IMPLANT
SUT MNCRL AB 4-0 PS2 18 (SUTURE) IMPLANT
SUT VIC AB 0 CT1 27 (SUTURE) ×2
SUT VIC AB 0 CT1 27XBRD ANBCTR (SUTURE) ×1 IMPLANT
SUT VIC AB 1 CT1 27 (SUTURE) ×2
SUT VIC AB 1 CT1 27XBRD ANBCTR (SUTURE) ×1 IMPLANT
SUT VIC AB 2-0 CT1 27 (SUTURE) ×2
SUT VIC AB 2-0 CT1 TAPERPNT 27 (SUTURE) ×1 IMPLANT
TOWEL OR 17X24 6PK STRL BLUE (TOWEL DISPOSABLE) ×3 IMPLANT
TOWEL OR 17X26 10 PK STRL BLUE (TOWEL DISPOSABLE) ×3 IMPLANT
WATER STERILE IRR 1000ML POUR (IV SOLUTION) ×6 IMPLANT

## 2018-08-07 NOTE — Plan of Care (Signed)
  Problem: Activity: Goal: Ability to avoid complications of mobility impairment will improve Outcome: Progressing Goal: Ability to tolerate increased activity will improve Outcome: Progressing   Problem: Pain Management: Goal: Pain level will decrease with appropriate interventions Outcome: Progressing   

## 2018-08-07 NOTE — Brief Op Note (Signed)
08/07/2018  11:57 AM  PATIENT:  [name] [name]  52 y.o. male  PRE-OPERATIVE DIAGNOSIS:  osteoarthritis right hip  POST-OPERATIVE DIAGNOSIS:  osteoarthritis right hip  PROCEDURE:  Procedure(s) with comments: RIGHT TOTAL HIP ARTHROPLASTY ANTERIOR APPROACH (Right) - Needs RNFA  SURGEON:  Surgeon(s) and Role:    Kathryne Hitch* Blackman, Christopher Y, MD - Primary  ASSISTANT:  Patrick Jupiterarla Bethune, RNFA  ANESTHESIA:   spinal  EBL:  300 mL   COUNTS:  YES  TOURNIQUET:  * No tourniquets in log *  DICTATION: .Other Dictation: Dictation Number [id number]  PLAN OF CARE: Admit to inpatient   PATIENT DISPOSITION:  PACU - hemodynamically stable.   Delay start of Pharmacological VTE agent (>24hrs) due to surgical blood loss or risk of bleeding: no

## 2018-08-07 NOTE — Evaluation (Signed)
Physical Therapy Evaluation Patient Details Name: [name] FullerJohn [name] MRN: [id number] DOB: 10/05/1965 Today's Date: 08/07/2018   History of Present Illness  Pt presents for R anterior THA. PMH: DM, HTN, asthma  Clinical Impression  Pt is s/p THA resulting in the deficits listed below (see PT Problem List). Pt very pleased to be experiencing less pain than he had before surgery. Mod I with bed mobility and transfers. Ambulated 100' with RW and supervision. On track for d/c home tomorrow.  Pt will benefit from skilled PT to increase their independence and safety with mobility to allow discharge to the venue listed below.      Follow Up Recommendations Follow surgeon's recommendation for DC plan and follow-up therapies    Equipment Recommendations  None recommended by PT    Recommendations for Other Services       Precautions / Restrictions Precautions Precautions: None Restrictions Weight Bearing Restrictions: Yes RLE Weight Bearing: Weight bearing as tolerated      Mobility  Bed Mobility Overal bed mobility: Modified Independent                Transfers Overall transfer level: Modified independent Equipment used: Rolling walker (2 wheeled)             General transfer comment: vc's for hand placement with RW as he was not familiar  Ambulation/Gait Ambulation/Gait assistance: Supervision Gait Distance (Feet): 100 Feet Assistive device: Rolling walker (2 wheeled) Gait Pattern/deviations: Step-through pattern;Antalgic Gait velocity: decreased Gait velocity interpretation: 1.31 - 2.62 ft/sec, indicative of limited community ambulator General Gait Details: Decreased R hip extension noted  Stairs            Wheelchair Mobility    Modified Rankin (Stroke Patients Only)       Balance Overall balance assessment: No apparent balance deficits (not formally assessed)                                           Pertinent Vitals/Pain Pain  Assessment: 0-10 Pain Score: 2  Pain Location: R hip Pain Descriptors / Indicators: Burning Pain Intervention(s): Limited activity within patient's tolerance    Home Living Family/patient expects to be discharged to:: Private residence Living Arrangements: Spouse/significant other;Children Available Help at Discharge: Family;Available PRN/intermittently Type of Home: House Home Access: Stairs to enter   Entrance Stairs-Number of Steps: 4 Home Layout: One level Home Equipment: Walker - 2 wheels;Cane - single point      Prior Function Level of Independence: Independent         Comments: pt works as a Pharmacist, hospitalparamedic     Hand Dominance   Dominant Hand: Right    Extremity/Trunk Assessment   Upper Extremity Assessment Upper Extremity Assessment: Overall WFL for tasks assessed    Lower Extremity Assessment Lower Extremity Assessment: Overall WFL for tasks assessed    Cervical / Trunk Assessment Cervical / Trunk Assessment: Normal  Communication   Communication: No difficulties  Cognition Arousal/Alertness: Awake/alert Behavior During Therapy: WFL for tasks assessed/performed Overall Cognitive Status: Within Functional Limits for tasks assessed                                        General Comments General comments (skin integrity, edema, etc.): pt has had extreme pain for a long time, already feeling better  Exercises Total Joint Exercises Ankle Circles/Pumps: AROM;Both;10 reps;Seated Quad Sets: AROM;Both;10 reps;Seated   Assessment/Plan    PT Assessment Patient needs continued PT services  PT Problem List Decreased activity tolerance;Decreased mobility;Pain;Decreased knowledge of use of DME       PT Treatment Interventions DME instruction;Gait training;Stair training;Functional mobility training;Therapeutic activities;Therapeutic exercise;Patient/family education    PT Goals (Current goals can be found in the Care Plan section)  Acute Rehab  PT Goals Patient Stated Goal: return home, decrease pain PT Goal Formulation: With patient Time For Goal Achievement: 08/14/18 Potential to Achieve Goals: Good    Frequency 7X/week   Barriers to discharge        Co-evaluation               AM-PAC PT "6 Clicks" Mobility  Outcome Measure Help needed turning from your back to your side while in a flat bed without using bedrails?: None Help needed moving from lying on your back to sitting on the side of a flat bed without using bedrails?: None Help needed moving to and from a bed to a chair (including a wheelchair)?: None Help needed standing up from a chair using your arms (e.g., wheelchair or bedside chair)?: None Help needed to walk in hospital room?: A Little Help needed climbing 3-5 steps with a railing? : A Little 6 Click Score: 22    End of Session   Activity Tolerance: Patient tolerated treatment well Patient left: in chair;with call bell/phone within reach Nurse Communication: Mobility status PT Visit Diagnosis: Pain;Difficulty in walking, not elsewhere classified (R26.2) Pain - Right/Left: Right Pain - part of body: Hip    Time: 1544-1600 PT Time Calculation (min) (ACUTE ONLY): 16 min   Charges:   PT Evaluation $PT Eval Low Complexity: 1 Low          Lyanne CoVictoria Idelia Caudell, PT  Acute Rehab Services  Pager [phone] Office [phone]   Lawana ChambersVictoria L Kaylon Laroche 08/07/2018, 4:20 PM

## 2018-08-07 NOTE — Discharge Instructions (Signed)

## 2018-08-07 NOTE — Anesthesia Preprocedure Evaluation (Signed)
Anesthesia Evaluation  Patient identified by MRN, date of birth, ID band Patient awake    Reviewed: Allergy & Precautions, NPO status , Patient's Chart, lab work & pertinent test results  Airway Mallampati: II  TM Distance: >3 FB Neck ROM: Full    Dental no notable dental hx.    Pulmonary sleep apnea , Current Smoker,    breath sounds clear to auscultation + decreased breath sounds      Cardiovascular hypertension, negative cardio ROS Normal cardiovascular exam Rhythm:Regular Rate:Normal     Neuro/Psych negative neurological ROS  negative psych ROS   GI/Hepatic negative GI ROS, (+)     substance abuse  alcohol use,   Endo/Other  diabetesMorbid obesity  Renal/GU negative Renal ROS  negative genitourinary   Musculoskeletal negative musculoskeletal ROS (+)   Abdominal (+) + obese,   Peds negative pediatric ROS (+)  Hematology negative hematology ROS (+)   Anesthesia Other Findings   Reproductive/Obstetrics negative OB ROS                             Anesthesia Physical Anesthesia Plan  ASA: IV  Anesthesia Plan: Spinal   Post-op Pain Management:    Induction: Intravenous  PONV Risk Score and Plan: 1 and Ondansetron  Airway Management Planned: Simple Face Mask  Additional Equipment:   Intra-op Plan:   Post-operative Plan:   Informed Consent: I have reviewed the patients History and Physical, chart, labs and discussed the procedure including the risks, benefits and alternatives for the proposed anesthesia with the patient or authorized representative who has indicated his/her understanding and acceptance.   Dental advisory given  Plan Discussed with: CRNA and Surgeon  Anesthesia Plan Comments:         Anesthesia Quick Evaluation

## 2018-08-07 NOTE — H&P (Signed)
TOTAL HIP ADMISSION H&P  Patient is admitted for right total hip arthroplasty.  Subjective:  Chief Complaint: right hip pain  HPI: [name] [name], 52 y.o. male, has a history of pain and functional disability in the right hip(s) due to arthritis and patient has failed non-surgical conservative treatments for greater than 12 weeks to include NSAID's and/or analgesics, corticosteriod injections, flexibility and strengthening excercises, use of assistive devices, weight reduction as appropriate and activity modification.  Onset of symptoms was gradual starting 3 years ago with gradually worsening course since that time.The patient noted no past surgery on the right hip(s).  Patient currently rates pain in the right hip at 10 out of 10 with activity. Patient has night pain, worsening of pain with activity and weight bearing, trendelenberg gait, pain that interfers with activities of daily living and pain with passive range of motion. Patient has evidence of subchondral cysts, subchondral sclerosis, periarticular osteophytes and joint space narrowing by imaging studies. This condition presents safety issues increasing the risk of falls.  There is no current active infection.  Patient Active Problem List   Diagnosis Date Noted  . Unilateral primary osteoarthritis, right hip 03/14/2018   Past Medical History:  Diagnosis Date  . Allergy   . Asthma   . Diabetes mellitus without complication (HCC)    type 2  . Hyperlipidemia   . Hypertension   . Sleep apnea    cpap    Past Surgical History:  Procedure Laterality Date  . WISDOM TOOTH EXTRACTION      Current Facility-Administered Medications  Medication Dose Route Frequency Provider Last Rate Last Dose  . ceFAZolin (ANCEF) 3 g in dextrose 5 % 50 mL IVPB  3 g Intravenous To SS-Surg Kathryne HitchBlackman, Nakira Litzau Y, MD      . chlorhexidine (HIBICLENS) 4 % liquid 4 application  60 mL Topical Once Richardean Canallark, Gilbert W, PA-C      . lactated ringers infusion    Intravenous Continuous Eilene Ghaziose, George, MD      . tranexamic acid (CYKLOKAPRON) IVPB 1,000 mg  1,000 mg Intravenous To OR Kathryne HitchBlackman, Tyrees Chopin Y, MD       Allergies  Allergen Reactions  . Simvastatin Other (See Comments)    myalgia  . Codeine Itching    Social History   Tobacco Use  . Smoking status: Current Every Day Smoker    Packs/day: 1.00    Types: Cigarettes  . Smokeless tobacco: Never Used  Substance Use Topics  . Alcohol use: Yes    Alcohol/week: 24.0 standard drinks    Types: 24 Cans of beer per week    Comment: per week    Family History  Problem Relation Age of Onset  . Heart disease Father   . Hyperlipidemia Father   . Hypertension Father   . Heart disease Brother   . Cancer Sister      Review of Systems  Musculoskeletal: Positive for back pain and joint pain.  All other systems reviewed and are negative.   Objective:  Physical Exam  Constitutional: He is oriented to person, place, and time. He appears well-developed and well-nourished.  HENT:  Head: Normocephalic and atraumatic.  Eyes: Pupils are equal, round, and reactive to light. EOM are normal.  Neck: Normal range of motion. Neck supple.  Cardiovascular: Normal rate.  Respiratory: Effort normal.  GI: Soft. Bowel sounds are normal.  Musculoskeletal:     Right hip: He exhibits decreased range of motion, decreased strength, tenderness and bony tenderness.  Neurological: He is  alert and oriented to person, place, and time.  Skin: Skin is warm and dry.  Psychiatric: He has a normal mood and affect.    Vital signs in last 24 hours: Temp:  [98.4 F (36.9 C)] 98.4 F (36.9 C) (12/24 0757) Pulse Rate:  [84] 84 (12/24 0757) Resp:  [18] 18 (12/24 0757) BP: (149)/(96) 149/96 (12/24 0757) SpO2:  [99 %] 99 % (12/24 0757) Weight:  [157.5 kg] 157.5 kg (12/24 0757)  Labs:   Estimated body mass index is 45.82 kg/m as calculated from the following:   Height as of this encounter: 6\' 1"  (1.854 m).    Weight as of this encounter: 157.5 kg.   Imaging Review Plain radiographs demonstrate severe degenerative joint disease of the right hip(s). The bone quality appears to be good for age and reported activity level.    Preoperative templating of the joint replacement has been completed, documented, and submitted to the Operating Room personnel in order to optimize intra-operative equipment management.     Assessment/Plan:  End stage arthritis, right hip(s)  The patient history, physical examination, clinical judgement of the provider and imaging studies are consistent with end stage degenerative joint disease of the right hip(s) and total hip arthroplasty is deemed medically necessary. The treatment options including medical management, injection therapy, arthroscopy and arthroplasty were discussed at length. The risks and benefits of total hip arthroplasty were presented and reviewed. The risks due to aseptic loosening, infection, stiffness, dislocation/subluxation,  thromboembolic complications and other imponderables were discussed.  The patient acknowledged the explanation, agreed to proceed with the plan and consent was signed. Patient is being admitted for inpatient treatment for surgery, pain control, PT, OT, prophylactic antibiotics, VTE prophylaxis, progressive ambulation and ADL's and discharge planning.The patient is planning to be discharged home with home health services

## 2018-08-07 NOTE — Anesthesia Procedure Notes (Signed)
Spinal  Patient location during procedure: OR Start time: 08/07/2018 10:01 AM End time: 08/07/2018 10:11 AM Staffing Anesthesiologist: Myrtie Soman, MD Performed: anesthesiologist  Preanesthetic Checklist Completed: patient identified, site marked, surgical consent, pre-op evaluation, timeout performed, IV checked, risks and benefits discussed and monitors and equipment checked Spinal Block Patient position: sitting Prep: Betadine Patient monitoring: heart rate, continuous pulse ox and blood pressure Location: L3-4 Injection technique: single-shot Needle Needle type: Sprotte  Needle gauge: 24 G Needle length: 9 cm Additional Notes Expiration date of kit checked and confirmed. Patient tolerated procedure well, without complications.

## 2018-08-07 NOTE — Anesthesia Postprocedure Evaluation (Signed)
Anesthesia Post Note  Patient: [name] FullerJohn [name]  Procedure(s) Performed: RIGHT TOTAL HIP ARTHROPLASTY ANTERIOR APPROACH (Right Hip)     Patient location during evaluation: PACU Anesthesia Type: Spinal Level of consciousness: oriented and awake and alert Pain management: pain level controlled Vital Signs Assessment: post-procedure vital signs reviewed and stable Respiratory status: spontaneous breathing, respiratory function stable and patient connected to nasal cannula oxygen Cardiovascular status: blood pressure returned to baseline and stable Postop Assessment: no headache, no backache and no apparent nausea or vomiting Anesthetic complications: no    Last Vitals:  Vitals:   08/07/18 1251 08/07/18 1304  BP: 136/79 (!) 127/91  Pulse: 73 73  Resp: 17 17  Temp: (!) 36.4 C   SpO2: 95% 97%    Last Pain:  Vitals:   08/07/18 1251  TempSrc:   PainSc: 0-No pain                 Kathline Banbury S

## 2018-08-07 NOTE — Op Note (Signed)
NAMValetta [name]: [name], [name] MEDICAL RECORD [id number] ACCOUNT [id number] DATE OF BIRTH:[birth year] FACILITY: [facility] LOCATION: [facility] PHYSICIAN:CHRISTOPHER Aretha ParrotY. BLACKMAN, MD  OPERATIVE REPORT  DATE OF PROCEDURE:  08/07/2018  PREOPERATIVE DIAGNOSES: 1.  Primary osteoarthritis and degenerative joint disease, right hip. 2.  Morbid obesity with a body mass index greater than 40.  POSTOPERATIVE DIAGNOSES:   1.  Primary osteoarthritis and degenerative joint disease, right hip. 2.  Morbid obesity with a body mass index greater than 40.  PROCEDURE:  Right total hip arthroplasty, direct anterior approach.  IMPLANTS:  DePuy Sector Gription acetabular component size 54 with a single screw, size 36+4 neutral polyethylene liner, size 12 Corail femoral component with standard offset, size 36+1.5 ceramic hip ball.  SURGEON:  Vanita PandaChristopher Y. Magnus IvanBlackman, MD  ASSISTANT:  Patrick Jupiterarla Bethune, RNFA  ANTIBIOTICS:  3 grams IV Ancef.  ESTIMATED BLOOD LOSS:  250-300 mL  COMPLICATIONS:  None.  INDICATIONS:  The patient is morbidly obese 52 year old gentleman with debilitating arthritis involving his right hip.  He has tried and failed all forms of conservative treatment.  X-ray shows significant and severe arthritis of his right hip.  He has  worked on weight loss as much as he can.  He has worked on activity modifications and using assistive device.  His arthritis is severe and he does wish to proceed with a total hip arthroplasty.  We had a long and thorough discussion about the risk of the  surgery.  He understands his risks are heightened given the severity of his obesity.  These include the risk of acute blood loss anemia, nerve or vessel injury, fracture, infection, dislocation, DVT and implant failure.  He understands our goals are to  decrease pain, improve mobility and overall improve quality of life.  DESCRIPTION OF PROCEDURE:  After informed consent was obtained and appropriate right hip was marked.  He was  brought to the operating room and sat up on a stretcher where spinal anesthesia was obtained.  He was then laid in supine position on a  stretcher.  Foley catheter was placed.  I assessed his leg lengths and found his right operative hip to be much shorter as far as his leg lengths on that right side comparing right to left.  Traction boots were placed on both his feet.  Next, he was  placed supine on the Hana fracture table with a perineal post in place and both legs in line skeletal traction device and no traction applied.  His right operative hip was prepped and draped with DuraPrep and sterile drapes.  A time-out was called and he  was identified as correct patient, correct right hip.  We then made an incision just inferior and posterior to the anterior superior iliac spine and dissected down to the tensor fascia.  The tensor fascia was then divided longitudinally to proceed with  direct anterior approach to the hip.  We identified and cauterized circumflex vessels and identified the hip capsule, opened the hip capsule in an L-type format, finding moderate to significant joint effusion with significant periarticular osteophytes  around the femoral head and neck.  We placed Cobra retractors around the medial and lateral femoral neck and made our femoral neck cut with an oscillating saw just proximal to the lesser trochanter and completed this with an osteotome.  We placed a  corkscrew guide in the femoral head and removed the femoral head in its entirety.  I then placed a bent Hohmann over the medial acetabular rim and removed remnants of the acetabular  labrum and other debris from the acetabulum.  We then began reaming  under direct visualization from a size 44 reamer in stepwise increments up to a size 53 with all reamers under direct visualization, the last reamer under direct fluoroscopy, so we will obtain our depth of reaming our inclination and anteversion.  We  then placed the real DePuy Sector  Gription acetabular component size 54 and a single screw.  We placed a 36+4 polyethylene liner for that size acetabular component.  Attention was then turned to the femur.  With the leg externally rotated to 120 degrees,  extended and adducted we were placing Mueller retractor medially and Hohman retractor and a Hohmann retractor behind the greater trochanter, released lateral joint capsule and used a box-cutting osteotome to enter femoral canal and a rongeur to  lateralize then began broaching from a size 8 broach using Corail broaching system going up to a size 12.  With a size 12 in place, we trialed a standard offset femoral neck and a 36+1.5 hip ball, reduced this in the acetabulum.  We were pleased with the  leg length, offset, range of motion and stability assessed through visualization, physical exam and under fluoroscopy.  We then dislocated the hip and removed the trial components.  We placed the real Corail femoral component size 12 with standard  offset and the real 36+1.5 metal ceramic hip ball, reduced this in the acetabulum and again it was stable.  We then irrigated the soft tissue with normal saline solution using pulsatile lavage.  Closed the joint capsule with interrupted #1 Ethibond  suture, followed by running 0 Vicryl and tensor fascia, 0 Vicryl in the deep tissue, 2-0 Vicryl subcutaneous tissue and interrupted staples on the skin.  Xeroform and Aquacel dressing was applied.  He was taken off the Hana table and taken to recovery  room in stable condition.  All final counts were correct.  No complications noted.  Of note, Patrick Jupiterarla Bethune, RNFA, assisted throughout the entire case and assistance.  Her assistance was crucial in facilitating all aspects of this case.  TN/NUANCE  D:08/07/2018 T:08/07/2018 JOB:[id number]

## 2018-08-07 NOTE — Transfer of Care (Signed)
Immediate Anesthesia Transfer of Care Note  Patient: [name] [name]  Procedure(s) Performed: RIGHT TOTAL HIP ARTHROPLASTY ANTERIOR APPROACH (Right Hip)  Patient Location: PACU  Anesthesia Type:MAC and Spinal  Level of Consciousness: awake, alert  and oriented  Airway & Oxygen Therapy: Patient Spontanous Breathing and Patient connected to face mask oxygen  Post-op Assessment: Report given to RN and Post -op Vital signs reviewed and stable  Post vital signs: Reviewed and stable  Last Vitals:  Vitals Value Taken Time  BP 135/88 08/07/2018 12:19 PM  Temp    Pulse 77 08/07/2018 12:20 PM  Resp 16 08/07/2018 12:20 PM  SpO2 100 % 08/07/2018 12:20 PM  Vitals shown include unvalidated device data.  Last Pain: 0    Patients Stated Pain Goal: 3 (26/33/35 4562)  Complications: No apparent anesthesia complications

## 2018-08-08 LAB — CBC
HCT: 36.8 % — ABNORMAL LOW (ref 39.0–52.0)
Hemoglobin: 12.8 g/dL — ABNORMAL LOW (ref 13.0–17.0)
MCH: 34.3 pg — AB (ref 26.0–34.0)
MCHC: 34.8 g/dL (ref 30.0–36.0)
MCV: 98.7 fL (ref 80.0–100.0)
Platelets: 175 10*3/uL (ref 150–400)
RBC: 3.73 MIL/uL — ABNORMAL LOW (ref 4.22–5.81)
RDW: 12.5 % (ref 11.5–15.5)
WBC: 15 10*3/uL — ABNORMAL HIGH (ref 4.0–10.5)
nRBC: 0 % (ref 0.0–0.2)

## 2018-08-08 LAB — BASIC METABOLIC PANEL
Anion gap: 10 (ref 5–15)
BUN: 17 mg/dL (ref 6–20)
CO2: 29 mmol/L (ref 22–32)
Calcium: 8.7 mg/dL — ABNORMAL LOW (ref 8.9–10.3)
Chloride: 96 mmol/L — ABNORMAL LOW (ref 98–111)
Creatinine, Ser: 0.98 mg/dL (ref 0.61–1.24)
GFR calc Af Amer: >60 mL/min (ref >60)
GFR calc non Af Amer: >60 mL/min (ref >60)
Glucose, Bld: 160 mg/dL — ABNORMAL HIGH (ref 70–99)
Potassium: 4.1 mmol/L (ref 3.5–5.1)
Sodium: 135 mmol/L (ref 135–145)

## 2018-08-08 LAB — GLUCOSE, CAPILLARY: Glucose-Capillary: 117 mg/dL — ABNORMAL HIGH (ref 70–99)

## 2018-08-08 MED ORDER — OXYCODONE HCL 5 MG PO TABS: 5.0000 mg | ORAL_TABLET | Freq: Four times a day (QID) | ORAL | 0 refills | Status: DC | PRN

## 2018-08-08 MED ORDER — METHOCARBAMOL 500 MG PO TABS: 500.0000 mg | ORAL_TABLET | Freq: Four times a day (QID) | ORAL | 0 refills | Status: DC | PRN

## 2018-08-08 MED ORDER — ASPIRIN 81 MG PO CHEW: 81.0000 mg | CHEWABLE_TABLET | Freq: Two times a day (BID) | ORAL | 0 refills | Status: AC

## 2018-08-08 NOTE — Discharge Summary (Signed)
Patient ID: [name] [name] MRN: [id number] DOB/AGE: 1966/02/21 52 y.o.  Admit date: 08/07/2018 Discharge date: 08/08/2018  Admission Diagnoses:  Principal Problem:   Unilateral primary osteoarthritis, right hip Active Problems:   Status post total replacement of left hip   Discharge Diagnoses:  Same  Past Medical History:  Diagnosis Date  . Allergy   . Asthma   . Diabetes mellitus without complication (HCC)    type 2  . Hyperlipidemia   . Hypertension   . Sleep apnea    cpap    Surgeries: Procedure(s): RIGHT TOTAL HIP ARTHROPLASTY ANTERIOR APPROACH on 08/07/2018   Consultants:   Discharged Condition: Improved  Hospital Course: [name] [name] is an 52 y.o. male who was admitted 08/07/2018 for operative treatment ofUnilateral primary osteoarthritis, right hip. Patient has severe unremitting pain that affects sleep, daily activities, and work/hobbies. After pre-op clearance the patient was taken to the operating room on 08/07/2018 and underwent  Procedure(s): RIGHT TOTAL HIP ARTHROPLASTY ANTERIOR APPROACH.    Patient was given perioperative antibiotics:  Anti-infectives (From admission, onward)   Start     Dose/Rate Route Frequency Ordered Stop   08/07/18 1600  ceFAZolin (ANCEF) IVPB 2g/100 mL premix     2 g 200 mL/hr over 30 Minutes Intravenous Every 6 hours 08/07/18 1345 08/07/18 2152   08/07/18 0900  ceFAZolin (ANCEF) 3 g in dextrose 5 % 50 mL IVPB     3 g 100 mL/hr over 30 Minutes Intravenous To ShortStay Surgical 08/06/18 0716 08/07/18 1015       Patient was given sequential compression devices, early ambulation, and chemoprophylaxis to prevent DVT.  Patient benefited maximally from hospital stay and there were no complications.    Recent vital signs:  Patient Vitals for the past 24 hrs:  BP Temp Temp src Pulse Resp SpO2  08/08/18 0338 121/82 98.2 F (36.8 C) Oral 85 16 97 %  08/07/18 2357 (!) 148/85 99 F (37.2 C) Oral 90 16 100 %  08/07/18 2030 - - -  78 17 94 %  08/07/18 1931 124/72 98.4 F (36.9 C) Oral 88 16 96 %  08/07/18 1401 123/83 (!) 96.9 F (36.1 C) - 69 19 94 %  08/07/18 1334 127/87 - - 66 (!) 21 99 %  08/07/18 1319 124/90 (!) 97.5 F (36.4 C) - 68 20 98 %  08/07/18 1304 (!) 127/91 - - 73 17 97 %  08/07/18 1251 136/79 - - 73 17 95 %  08/07/18 1234 106/88 - - 74 (!) 21 95 %  08/07/18 1219 135/88 (!) 97.5 F (36.4 C) - 77 13 99 %     Recent laboratory studies:  Recent Labs    08/08/18 0254  WBC 15.0*  HGB 12.8*  HCT 36.8*  PLT 175  NA 135  K 4.1  CL 96*  CO2 29  BUN 17  CREATININE 0.98  GLUCOSE 160*  CALCIUM 8.7*     Discharge Medications:   Allergies as of 08/08/2018      Reactions   Simvastatin Other (See Comments)   myalgia   Codeine Itching      Medication List    STOP taking these medications   aspirin EC 81 MG tablet Replaced by:  aspirin 81 MG chewable tablet   cyclobenzaprine 10 MG tablet Commonly known as:  FLEXERIL     TAKE these medications   acetaminophen 650 MG CR tablet Commonly known as:  TYLENOL Take 1,300 mg by mouth 2 (two) times daily. Hip pain.  albuterol 108 (90 Base) MCG/ACT inhaler Commonly known as:  PROVENTIL HFA;VENTOLIN HFA Inhale 1-2 puffs into the lungs every 6 (six) hours as needed for wheezing or shortness of breath.   aspirin 81 MG chewable tablet Chew 1 tablet (81 mg total) by mouth 2 (two) times daily. Replaces:  aspirin EC 81 MG tablet   carvedilol 12.5 MG tablet Commonly known as:  COREG Take 12.5 mg by mouth 2 (two) times daily.   citalopram 40 MG tablet Commonly known as:  CELEXA Take 40 mg by mouth every evening.   diphenhydrAMINE 25 MG tablet Commonly known as:  BENADRYL Take 50 mg by mouth at bedtime.   folic acid 1 MG tablet Commonly known as:  FOLVITE Take 1 mg by mouth daily.   hydrochlorothiazide 25 MG tablet Commonly known as:  HYDRODIURIL Take 25 mg by mouth daily.   ibuprofen 200 MG tablet Commonly known as:   ADVIL,MOTRIN Take 600 mg by mouth 2 (two) times daily. Hip pain.   Melatonin 10 MG Tabs Take 10 mg by mouth at bedtime.   metFORMIN 500 MG 24 hr tablet Commonly known as:  GLUCOPHAGE-XR Take 1,000 mg by mouth 2 (two) times daily.   methocarbamol 500 MG tablet Commonly known as:  ROBAXIN Take 1 tablet (500 mg total) by mouth every 6 (six) hours as needed for muscle spasms.   montelukast 10 MG tablet Commonly known as:  SINGULAIR Take 10 mg by mouth daily.   oxyCODONE 5 MG immediate release tablet Commonly known as:  Oxy IR/ROXICODONE Take 1-2 tablets (5-10 mg total) by mouth every 6 (six) hours as needed for moderate pain (pain score 4-6).   quinapril 40 MG tablet Commonly known as:  ACCUPRIL Take 20 mg by mouth daily.   testosterone cypionate 200 MG/ML injection Commonly known as:  DEPOTESTOSTERONE CYPIONATE Inject 1 mL into the muscle every 14 (fourteen) days.            Durable Medical Equipment  (From admission, onward)         Start     Ordered   08/07/18 1346  DME Walker rolling  Once    Question:  Patient needs a walker to treat with the following condition  Answer:  Status post total replacement of right hip   08/07/18 1345   08/07/18 1346  DME 3 n 1  Once     08/07/18 1345          Diagnostic Studies: Dg Pelvis Portable  Result Date: 08/07/2018 CLINICAL DATA:  Status post right total hip replacement. EXAM: PORTABLE PELVIS 1-2 VIEWS COMPARISON:  C-arm images obtained earlier today. Right hip radiographs dated 03/14/2018. FINDINGS: Right total hip prosthesis in satisfactory position and alignment. No fracture or dislocation seen. Lower lumbar spine degenerative changes. IMPRESSION: Satisfactory postoperative appearance of a right total hip prosthesis. Electronically Signed   By: Beckie SaltsSteven  Reid M.D.   On: 08/07/2018 12:59   Dg C-arm 1-60 Min  Result Date: 08/07/2018 CLINICAL DATA:  Right hip replacement EXAM: DG C-ARM 61-120 MIN; OPERATIVE RIGHT HIP WITH  PELVIS COMPARISON:  None. FINDINGS: Changes of right hip replacement. Normal AP alignment. No hardware bony complicating feature. IMPRESSION: Right hip replacement.  No visible complicating feature. Electronically Signed   By: Charlett NoseKevin  Dover M.D.   On: 08/07/2018 11:52   Dg Hip Operative Unilat W Or W/o Pelvis Right  Result Date: 08/07/2018 CLINICAL DATA:  Right hip replacement EXAM: DG C-ARM 61-120 MIN; OPERATIVE RIGHT HIP WITH PELVIS COMPARISON:  None. FINDINGS: Changes of right hip replacement. Normal AP alignment. No hardware bony complicating feature. IMPRESSION: Right hip replacement.  No visible complicating feature. Electronically Signed   By: Charlett NoseKevin  Dover M.D.   On: 08/07/2018 11:52    Disposition: Discharge disposition: 01-Home or Self Care         Follow-up Information    Kathryne HitchBlackman, Kahliya Fraleigh Y, MD Follow up in 2 week(s).   Specialty:  Orthopedic Surgery Contact information: [address] [place] [address] [phone]        [facility] Follow up.   Specialty:  Home Health Services Why:  A representative from [facility] will contact you to arrange start date and time for your therapy. Contact information: [address] [place] [address] [phone]            Signed: Kathryne HitchChristopher Y Mersedes Alber 08/08/2018, 11:45 AM

## 2018-08-08 NOTE — Progress Notes (Signed)
Review discharge paper work and medications called into pharmacy with full understanding.

## 2018-08-08 NOTE — Care Management Note (Addendum)
Case Management Note  Patient Details  Name: [name] [name] MRN: [id number] Date of Birth: Aug 08, 1966  Subjective/Objective:  52 yr old gentleman s/p right Total hip arthroplasty, anterior approach.                    Action/Plan: Case manager spoke with patient concerning discharge plan and DME. Referral for Home Health called to Shaune LeeksJermaine Jenkins, [facility]. He will check for availability and return call. Cm explained to patient and will contact him at [facility] Patient's cell:[phone], wife: [phone].   Expected Discharge Date:  08/08/18               Expected Discharge Plan:  Home w Home Health Services  In-House Referral:  NA  Discharge planning Services  CM Consult  Post Acute Care Choice:  Home Health Choice offered to:  Patient  DME Arranged:  N/A(has RW and cane, declines 3in1 ) DME Agency:  NA  HH Arranged:  PT HH Agency:     Status of Service:  In process, will continue to follow  If discussed at Long Length of Stay Meetings, dates discussed:    Additional Comments:9:09AM - [facility] will provided therapy at discharge. Patient updated.   [name], [name], RN 08/08/2018, 8:55 AM

## 2018-08-08 NOTE — Evaluation (Signed)
Occupational Therapy Evaluation Patient Details Name: [name] FullerJohn [name] MRN: [id number] DOB: 10/28/1965 Today's Date: 08/08/2018    History of Present Illness Pt presents for R anterior THA. PMH: DM, HTN, asthma   Clinical Impression   This 52 y/o male presents with the above. At baseline pt is independent with ADLs, iADLs and functional mobility. Pt presents supine in bed pleasant and motivated to work with therapy. Pt performing functional mobility and practicing walk-in shower transfer (simulated in room) using RW with overall minguard-supervision assist. He is currently mod independent with UB ADL, [facility] due to increased difficulty accessing LEs. Pt reports he will return home with spouse and children who are able to assist with ADLs PRN. Education provided and questions answered throughout with no further OT needs identified at this time. Feel pt is safe to return home once medically ready from OT standpoint. Acute OT to sign off. Thank you for this referral.     Follow Up Recommendations  No OT follow up;Supervision/Assistance - 24 hour(24hr initially)    Equipment Recommendations  None recommended by OT           Precautions / Restrictions Precautions Precautions: None Restrictions Weight Bearing Restrictions: Yes RLE Weight Bearing: Weight bearing as tolerated      Mobility Bed Mobility Overal bed mobility: Modified Independent             General bed mobility comments: HOB slightly elevated  Transfers Overall transfer level: Modified independent Equipment used: Rolling walker (2 wheeled) Transfers: Sit to/from Stand           General transfer comment: Pt utilized correct technique    Balance Overall balance assessment: No apparent balance deficits (not formally assessed)                                         ADL either performed or assessed with clinical judgement   ADL Overall ADL's : Needs  assistance/impaired Eating/Feeding: Independent;Sitting   Grooming: Min guard;Supervision/safety;Standing   Upper Body Bathing: Set up;Sitting   Lower Body Bathing: Minimal assistance;Sit to/from stand Lower Body Bathing Details (indicate cue type and reason): to access LEs Upper Body Dressing : Modified independent;Sitting   Lower Body Dressing: Moderate assistance;Sit to/from stand Lower Body Dressing Details (indicate cue type and reason): due to difficulty accessing LEs, supervision-minguard standing balance; educated to don operative LE first Toilet Transfer: Min guard;Supervision/safety;Ambulation;Regular Social workerToilet   Toileting- Clothing Manipulation and Hygiene: Min guard;Sit to/from stand   Tub/ Shower Transfer: Walk-in shower;Min guard;Ambulation;Rolling walker Tub/Shower Transfer Details (indicate cue type and reason): cues for sequencing; pt return demonstrating with minguard assist Functional mobility during ADLs: Min guard;Supervision/safety;Rolling walker General ADL Comments: pt reports some stiffness with mobility though overall moving well     Vision         Perception     Praxis      Pertinent Vitals/Pain Pain Assessment: 0-10 Pain Score: 2 (when walking) Faces Pain Scale: Hurts a little bit Pain Location: R hip Pain Descriptors / Indicators: Sore(stiff) Pain Intervention(s): Limited activity within patient's tolerance;Monitored during session     Hand Dominance     Extremity/Trunk Assessment Upper Extremity Assessment Upper Extremity Assessment: Overall WFL for tasks assessed   Lower Extremity Assessment Lower Extremity Assessment: Defer to PT evaluation   Cervical / Trunk Assessment Cervical / Trunk Assessment: Normal   Communication Communication Communication:  No difficulties   Cognition Arousal/Alertness: Awake/alert Behavior During Therapy: WFL for tasks assessed/performed Overall Cognitive Status: Within Functional Limits for tasks  assessed                                     General Comments  No family present.      Exercises Exercises: (Verbally discussed THA exercises. Pt declined to practice)   Shoulder Instructions      Home Living Family/patient expects to be discharged to:: Private residence Living Arrangements: Spouse/significant other;Children Available Help at Discharge: Family;Available PRN/intermittently Type of Home: House Home Access: Stairs to enter Entergy CorporationEntrance Stairs-Number of Steps: 4   Home Layout: One level     Bathroom Shower/Tub: Producer, television/film/videoWalk-in shower   Bathroom Toilet: Standard     Home Equipment: Environmental consultantWalker - 2 wheels;Cane - single point;Shower seat - built in;Grab bars - tub/shower          Prior Functioning/Environment Level of Independence: Independent        Comments: pt works as a Radiation protection practitionerparamedic; 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 but was managing        OT Problem List: Decreased range of motion;Decreased strength;Decreased activity tolerance;Impaired balance (sitting and/or standing);Pain      OT Treatment/Interventions:      OT Goals(Current goals can be found in the care plan section) Acute Rehab OT Goals Patient Stated Goal: go home today OT Goal Formulation: All assessment and education complete, DC therapy  OT Frequency:     Barriers to D/C:            Co-evaluation              AM-PAC OT "6 Clicks" Daily Activity     Outcome Measure Help from another person eating meals?: None Help from another person taking care of personal grooming?: None Help from another person toileting, which includes using toliet, bedpan, or urinal?: A Little Help from another person bathing (including washing, rinsing, drying)?: A Little Help from another person to put on and taking off regular upper body clothing?: None Help from another person to put on and taking off regular lower body clothing?: A Little 6 Click Score: 21   End of Session Equipment Utilized  During Treatment: Gait belt;Rolling walker Nurse Communication: Mobility status  Activity Tolerance: Patient tolerated treatment well Patient left: in chair;with call bell/phone within reach  OT Visit Diagnosis: Other abnormalities of gait and mobility (R26.89)                Time: 0981-1914: 0808-0832 OT Time Calculation (min): 24 min Charges:  OT General Charges $OT Visit: 1 Visit OT Evaluation $OT Eval Low Complexity: 1 Low OT Treatments $Self Care/Home Management : 8-22 mins  Marcy SirenBreanna Sabrinia Prien, OT Supplemental Rehabilitation Services Pager [phone] Office [phone]   Orlando PennerBreanna L Hammond Obeirne 08/08/2018, 9:30 AM

## 2018-08-08 NOTE — Progress Notes (Signed)
Patient ID: [name] FullerJohn [name], male   DOB: 1965-09-16, 52 y.o.   MRN: [id number] Doping very well.  Vitals stable and right hip stable.  Can be discharged to home today.

## 2018-08-08 NOTE — Progress Notes (Signed)
Physical Therapy Treatment Patient Details Name: [name] [name] MRN: [id number] DOB: September 14, 1965 Today's Date: 08/08/2018    History of Present Illness Pt presents for R anterior THA. PMH: DM, HTN, asthma    PT Comments    Pt making excellent progress and anticipates DC home as soon as he can urinate. I have encouraged the patient to gradually increase activity daily to tolerance.  I have answered all patient's question regarding PT and mobility.       Follow Up Recommendations        Equipment Recommendations       Recommendations for Other Services       Precautions / Restrictions Precautions Precautions: Fall Restrictions Weight Bearing Restrictions: Yes RLE Weight Bearing: Weight bearing as tolerated    Mobility  Bed Mobility Overal bed mobility: (NT, pt up in chair)              Transfers Overall transfer level: Modified independent Equipment used: Rolling walker (2 wheeled) Transfers: Sit to/from Stand           General transfer comment: Pt utilized correct technique  Ambulation/Gait Ambulation/Gait assistance: Chief Operating Officerupervision Gait Distance (Feet): 100 Feet Assistive device: Rolling walker (2 wheeled) Gait Pattern/deviations: Step-through pattern;Antalgic         Stairs Stairs: Yes       General stair comments: Verbally discussed best technique for stairs. Pt declined practicing.    Wheelchair Mobility    Modified Rankin (Stroke Patients Only)       Balance                                            Cognition Arousal/Alertness: Awake/alert Behavior During Therapy: WFL for tasks assessed/performed Overall Cognitive Status: Within Functional Limits for tasks assessed                                        Exercises      General Comments General comments (skin integrity, edema, etc.): No family present.        Pertinent Vitals/Pain Pain Assessment: 0-10 Pain Score: 2 (when walking) Faces  Pain Scale: Hurts a little bit Pain Location: R hip Pain Descriptors / Indicators: Cramping Pain Intervention(s): Limited activity within patient's tolerance;Monitored during session    Home Living Family/patient expects to be discharged to:: Private residence Living Arrangements: Spouse/significant other;Children Available Help at Discharge: Family;Available PRN/intermittently Type of Home: House Home Access: Stairs to enter   Home Layout: One level Home Equipment: Environmental consultantWalker - 2 wheels;Cane - single point;Shower seat - built in;Grab bars - tub/shower      Prior Function Level of Independence: Independent      Comments: pt works as a Radiation protection practitionerparamedic; 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 but was managing   PT Goals (current goals can now be found in the care plan section) Acute Rehab PT Goals Patient Stated Goal: go home today Progress towards PT goals: Progressing toward goals    Frequency           PT Plan Current plan remains appropriate    Co-evaluation              AM-PAC PT "6 Clicks" Mobility   Outcome Measure  Help needed turning from your back to your side while in a flat bed without using  bedrails?: None Help needed moving from lying on your back to sitting on the side of a flat bed without using bedrails?: None Help needed moving to and from a bed to a chair (including a wheelchair)?: None Help needed standing up from a chair using your arms (e.g., wheelchair or bedside chair)?: None Help needed to walk in hospital room?: None Help needed climbing 3-5 steps with a railing? : A Little 6 Click Score: 23    End of Session   Activity Tolerance: Patient tolerated treatment well Patient left: in chair;with call bell/phone within reach Nurse Communication: Mobility status;Other (comment)(Pt cleared for DC from mobility standpoint)       Time: [id number] PT Time Calculation (min) (ACUTE ONLY): 13 min  Charges:  $Gait Training: 8-22 mins                      Lavona MoundMark Estus Krakowski, PT   Acute Rehabilitation Services  Pager [phone] Office [phone] 08/08/2018    Donnella ShamSawulski, Ebelyn Bohnet J 08/08/2018, 9:24 AM

## 2018-08-09 ENCOUNTER — Encounter (HOSPITAL_COMMUNITY): Payer: Self-pay | Admitting: Orthopaedic Surgery

## 2018-08-10 ENCOUNTER — Telehealth (INDEPENDENT_AMBULATORY_CARE_PROVIDER_SITE_OTHER): Payer: Self-pay | Admitting: Radiology

## 2018-08-10 NOTE — Telephone Encounter (Signed)
That will be fine. 

## 2018-08-10 NOTE — Telephone Encounter (Signed)
Elnita Maxwellheryl, [facility] orders for HHPT 1 week 1, 2 week 3. Please advise.  CB [phone]

## 2018-08-10 NOTE — Telephone Encounter (Signed)
I left voicemail for Paterosheryl advising.

## 2018-08-14 ENCOUNTER — Telehealth (INDEPENDENT_AMBULATORY_CARE_PROVIDER_SITE_OTHER): Payer: Self-pay | Admitting: *Deleted

## 2018-08-14 NOTE — Telephone Encounter (Signed)
That will be fine. 

## 2018-08-14 NOTE — Telephone Encounter (Signed)
[facility] stating pt missed his PT today d/t having company over and pt feels like he only needs one more visit; Elnita MaxwellCheryl says she will go out to access him when he is ready to see if one visit will be enough. Please advise.  Cheryl call back # [phone]

## 2018-08-14 NOTE — Telephone Encounter (Signed)
Ok

## 2018-08-14 NOTE — Telephone Encounter (Signed)
[facility]

## 2018-08-22 ENCOUNTER — Encounter (INDEPENDENT_AMBULATORY_CARE_PROVIDER_SITE_OTHER): Payer: Self-pay | Admitting: Orthopaedic Surgery

## 2018-08-22 ENCOUNTER — Ambulatory Visit (INDEPENDENT_AMBULATORY_CARE_PROVIDER_SITE_OTHER): Payer: 59 | Admitting: Orthopaedic Surgery

## 2018-08-22 DIAGNOSIS — Z96641 Presence of right artificial hip joint: Secondary | ICD-10-CM | POA: Insufficient documentation

## 2018-08-22 MED ORDER — OXYCODONE HCL 5 MG PO TABS: 5.0000 mg | ORAL_TABLET | Freq: Four times a day (QID) | ORAL | 0 refills | Status: DC | PRN

## 2018-08-22 MED ORDER — LORAZEPAM 1 MG PO TABS: 1.0000 mg | ORAL_TABLET | Freq: Every day | ORAL | 0 refills | Status: DC | PRN

## 2018-08-22 NOTE — Progress Notes (Signed)
The patient comes in today at 2 weeks status post a right total hip arthroplasty.  He is doing very well overall.  He is ambulating with a cane.  He reports increased range of motion and strength.  On exam with him laying supine he can perform a straight leg raise easily on the right operative side.  His incision looks great.  Remove staples in place Steri-Strips.  He did have a moderate seroma and I did aspirate about 90 cc of fluid off with it..  This gave him immediate relief.  At this point he will stop his aspirin twice a day.  He will continue increase his activities as comfort allows.  I did send in some oxycodone for pain and at least a few tablets of Ativan for some anxiety.  He will use these sparingly.  We will see him back in 4 weeks see how is doing overall.  If he does have a reaccumulation of a seroma and is uncomfortable he can call to come in to be seen earlier if needed for aspiration.

## 2018-08-31 ENCOUNTER — Telehealth (INDEPENDENT_AMBULATORY_CARE_PROVIDER_SITE_OTHER): Payer: Self-pay | Admitting: Orthopaedic Surgery

## 2018-08-31 MED ORDER — METHOCARBAMOL 500 MG PO TABS: 500.0000 mg | ORAL_TABLET | Freq: Four times a day (QID) | ORAL | 0 refills | Status: DC | PRN

## 2018-08-31 NOTE — Telephone Encounter (Signed)
Please advise 

## 2018-08-31 NOTE — Telephone Encounter (Signed)
Patient called needing Rx refilled methocarbamol. The number to contact patient is [phone]

## 2018-09-17 ENCOUNTER — Ambulatory Visit (INDEPENDENT_AMBULATORY_CARE_PROVIDER_SITE_OTHER): Payer: 59 | Admitting: Orthopaedic Surgery

## 2018-09-17 ENCOUNTER — Encounter (INDEPENDENT_AMBULATORY_CARE_PROVIDER_SITE_OTHER): Payer: Self-pay | Admitting: Orthopaedic Surgery

## 2018-09-17 DIAGNOSIS — Z96641 Presence of right artificial hip joint: Secondary | ICD-10-CM

## 2018-09-17 MED ORDER — OXYCODONE HCL 5 MG PO TABS: 5.0000 mg | ORAL_TABLET | Freq: Four times a day (QID) | ORAL | 0 refills | Status: DC | PRN

## 2018-09-17 NOTE — Progress Notes (Signed)
The patient is now 6 weeks status post a right total hip arthroplasty.  He is only 53 years old and does weigh 3 and 47 pounds.  He has been getting over the surgery well and doing great overall.  His hip disease was significant.  He has some little complaints of aches and pains but he feels like his stiffness is decreasing and his range of motion is increasing.  He would like to have a note actually on him to return to work starting MRI.  On exam he easily lets me put his range of motion through his right hip without any significant difficulties at all.  His incisions healed nicely.  At this standpoint I will need to see him back for 6 months unless he is having issues.  At that visit I like a standing AP pelvis and lateral of his right operative hip.

## 2018-10-12 ENCOUNTER — Telehealth (INDEPENDENT_AMBULATORY_CARE_PROVIDER_SITE_OTHER): Payer: Self-pay | Admitting: Orthopaedic Surgery

## 2018-10-12 MED ORDER — METHOCARBAMOL 500 MG PO TABS: 500.0000 mg | ORAL_TABLET | Freq: Four times a day (QID) | ORAL | 0 refills | Status: DC | PRN

## 2018-10-12 MED ORDER — OXYCODONE HCL 5 MG PO TABS: 5.0000 mg | ORAL_TABLET | Freq: Four times a day (QID) | ORAL | 0 refills | Status: DC | PRN

## 2018-10-12 NOTE — Telephone Encounter (Signed)
Patient called and requested a refill Oxycodone & Robaxin  Please call patient to advise.  [phone]

## 2018-10-12 NOTE — Telephone Encounter (Signed)
Please advise 

## 2018-11-04 ENCOUNTER — Other Ambulatory Visit (INDEPENDENT_AMBULATORY_CARE_PROVIDER_SITE_OTHER): Payer: Self-pay | Admitting: Orthopaedic Surgery

## 2018-11-05 MED ORDER — OXYCODONE HCL 5 MG PO TABS: 5.0000 mg | ORAL_TABLET | Freq: Four times a day (QID) | ORAL | 0 refills | Status: DC | PRN

## 2018-11-05 MED ORDER — METHOCARBAMOL 500 MG PO TABS: 500.0000 mg | ORAL_TABLET | Freq: Four times a day (QID) | ORAL | 0 refills | Status: DC | PRN

## 2018-11-05 NOTE — Telephone Encounter (Signed)
Please advise 

## 2018-11-18 ENCOUNTER — Encounter (INDEPENDENT_AMBULATORY_CARE_PROVIDER_SITE_OTHER): Payer: Self-pay | Admitting: Orthopaedic Surgery

## 2018-11-21 ENCOUNTER — Other Ambulatory Visit (INDEPENDENT_AMBULATORY_CARE_PROVIDER_SITE_OTHER): Payer: Self-pay | Admitting: Orthopaedic Surgery

## 2018-11-21 MED ORDER — METHOCARBAMOL 500 MG PO TABS: 500.0000 mg | ORAL_TABLET | Freq: Four times a day (QID) | ORAL | 0 refills | Status: DC | PRN

## 2018-11-21 NOTE — Telephone Encounter (Signed)
CB pt 

## 2018-11-21 NOTE — Telephone Encounter (Signed)
Dr. Blackman patient 

## 2018-11-21 NOTE — Telephone Encounter (Signed)
Please advise 

## 2019-09-16 IMAGING — RF DG HIP (WITH PELVIS) OPERATIVE*R*
1 series · 2 of 2 positions shown · non-contrast
Comparison: None.

CLINICAL DATA: Right hip replacement

EXAM:
DG C-ARM 61-120 MIN; OPERATIVE RIGHT HIP WITH PELVIS

[Series 1: unknown protocol · 0.20mm/px · 2 of 2 slices shown]
[im 1/2]
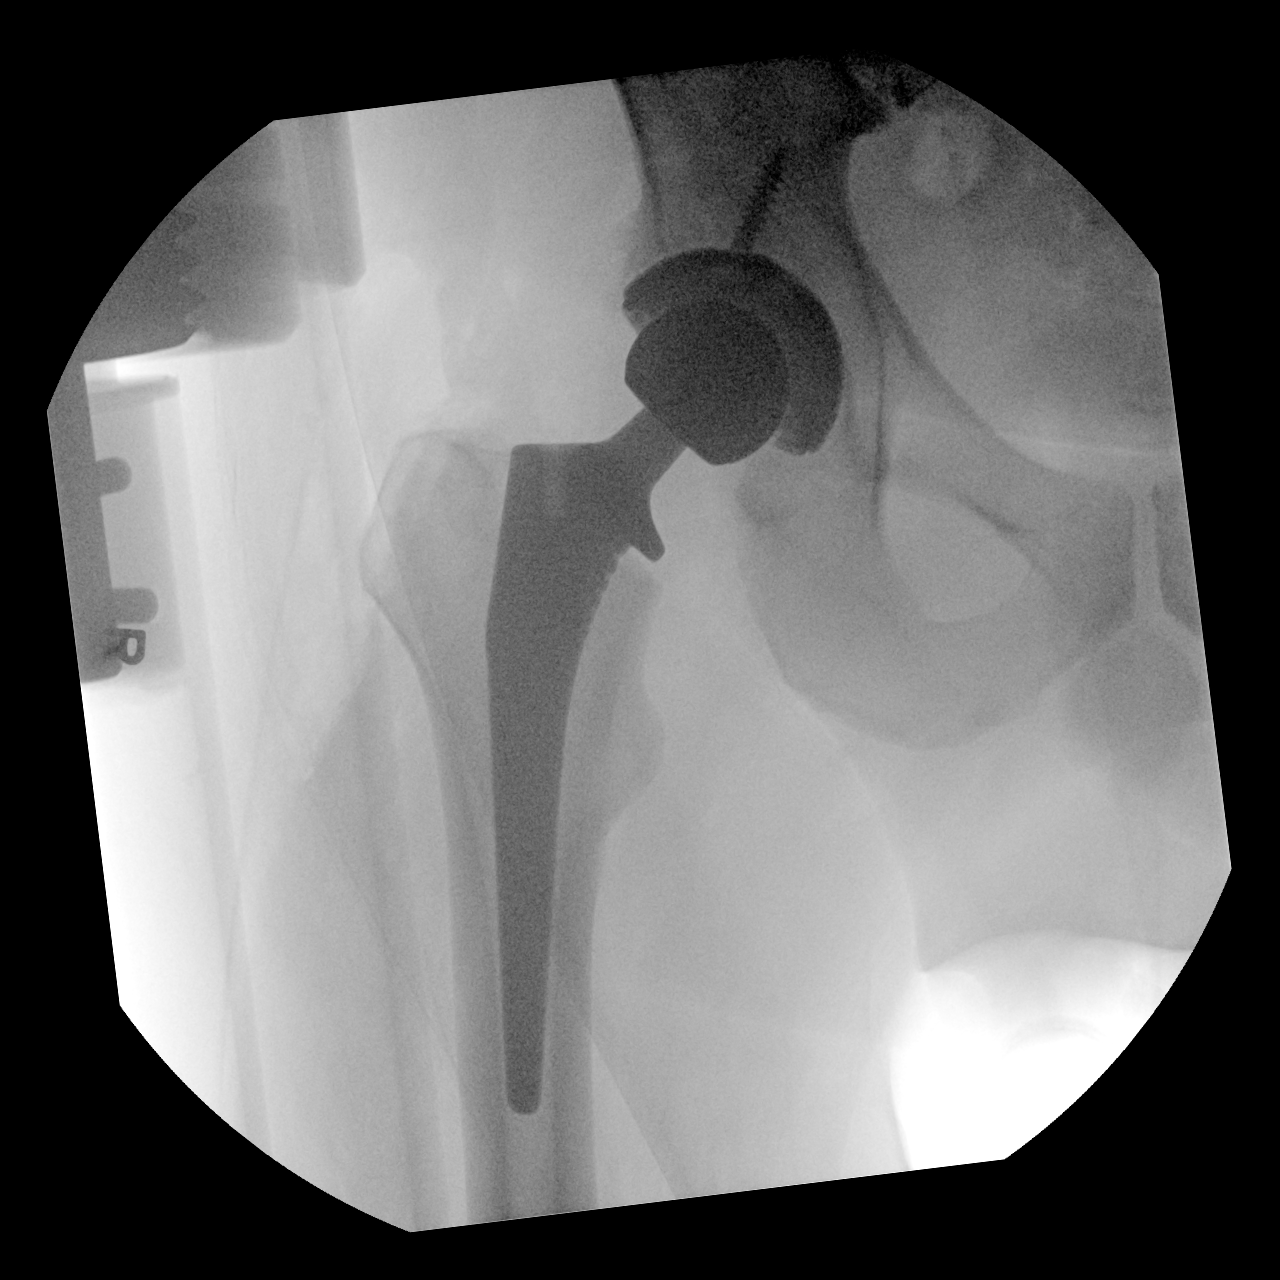
[im 2/2]
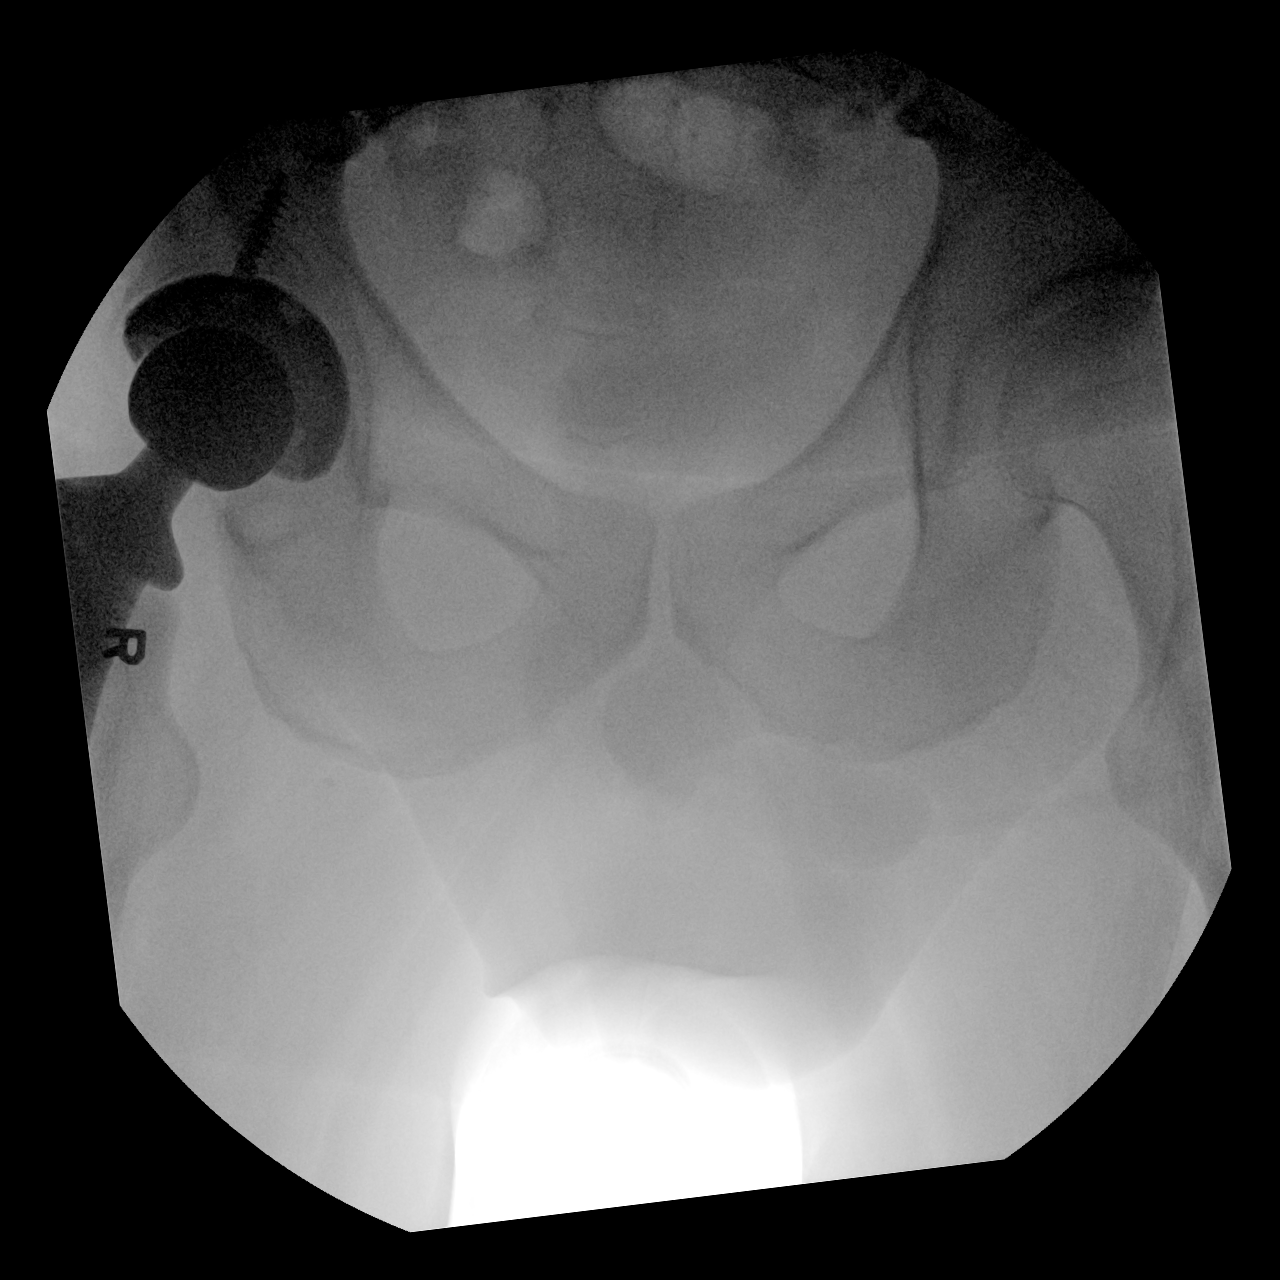

[2 of 2 positions shown; findings below may reference images not displayed]

FINDINGS: Changes of right hip replacement. Normal AP alignment. No hardware
bony complicating feature.
IMPRESSION: Right hip replacement.  No visible complicating feature.

## 2020-11-30 ENCOUNTER — Other Ambulatory Visit (HOSPITAL_COMMUNITY): Payer: Self-pay

## 2022-07-13 ENCOUNTER — Ambulatory Visit: Payer: Self-pay

## 2022-07-13 ENCOUNTER — Encounter: Payer: Self-pay | Admitting: Orthopaedic Surgery

## 2022-07-13 ENCOUNTER — Ambulatory Visit (INDEPENDENT_AMBULATORY_CARE_PROVIDER_SITE_OTHER): Payer: 59 | Admitting: Sports Medicine

## 2022-07-13 ENCOUNTER — Ambulatory Visit (INDEPENDENT_AMBULATORY_CARE_PROVIDER_SITE_OTHER): Payer: 59

## 2022-07-13 ENCOUNTER — Ambulatory Visit: Payer: 59 | Admitting: Orthopaedic Surgery

## 2022-07-13 VITALS — Ht 73.0 in | Wt 344.0 lb

## 2022-07-13 DIAGNOSIS — M25552 Pain in left hip: Secondary | ICD-10-CM

## 2022-07-13 MED ORDER — LIDOCAINE HCL 1 % IJ SOLN
4.0000 mL | INTRAMUSCULAR | Status: AC | PRN
  Administered 2022-07-13: 4 mL

## 2022-07-13 MED ORDER — METHYLPREDNISOLONE ACETATE 40 MG/ML IJ SUSP
40.0000 mg | INTRAMUSCULAR | Status: AC | PRN
  Administered 2022-07-13: 40 mg via INTRA_ARTICULAR

## 2022-07-13 NOTE — Progress Notes (Signed)
   Procedure Note  Patient: [name] [name]             Date of Birth: 11/16/1965           MRN: [id number]             Visit Date: 07/13/2022  Procedures: Visit Diagnoses:  1. Pain in left hip    Large Joint Inj: L hip joint on 07/13/2022 10:05 AM Indications: pain Details: 22 G 3.5 in needle, ultrasound-guided anterior approach Medications: 4 mL lidocaine 1 %; 40 mg methylPREDNISolone acetate 40 MG/ML Outcome: tolerated well, no immediate complications  Procedure: US-guided intra-articular hip injection, left After discussion on risks/benefits/indications and informed verbal consent was obtained, a timeout was performed. Patient was lying supine on exam table. The hip was cleaned with betadine and alcohol swabs. Then utilizing ultrasound guidance, the patient's femoral head and neck junction was identified. It did appear a small effusion may have been present. Sterile needle switch was performed and aspirated only about 1cc of clear synovial fluid. Then using ultrasound guidance the hip joint was subsequently injected with 4:1 lidocaine:depomedrol via an in-plane approach with ultrasound visualization of the injectate administered into the hip joint. Patient tolerated procedure well without immediate complications.  Procedure, treatment alternatives, risks and benefits explained, specific risks discussed. Consent was given by the patient. Immediately prior to procedure a time out was called to verify the correct patient, procedure, equipment, support staff and site/side marked as required. Patient was prepped and draped in the usual sterile fashion.     - I evaluated the patient about 10 minutes post-injection and he had good improvement in pain and range of motion - follow-up with Dr. Magnus Ivan as indicated in 4 weeks; I am happy to see them as needed  Madelyn Brunner, DO Primary Care Sports Medicine Physician  [facility]  This note was dictated using Dragon  naturally speaking software and may contain errors in syntax, spelling, or content which have not been identified prior to signing this note.

## 2022-07-13 NOTE — Progress Notes (Signed)
The patient is someone I have not seen in some time now.  Almost 4 years ago we replaced his right hip.  That hip is done very well for him in terms of no issues at all.  He still is significantly obese with a BMI of 45.  He has been developing now worsening left hip and groin pain.  He states that he did not want it to get as bad as his right hip did when he finally elected to have surgery.  He says his health has been good overall and he had gained a lot of weight but now he is lost some again.  He does report groin pain on the left side and again no issues on his right operative hip.  He has a harder time putting his shoes and socks on the left side as well.  On exam his right operative hip moves smoothly and fluidly.  His left hip actually does have good rotation with no blocks to rotation and no significant stiffness.  However he does exhibit pain when I rotate his hip and some of this is over the trochanteric area but a lot of this seems to be in the groin.  I was able to obtain an AP pelvis and lateral of his left hip today as well as review all of his previous x-rays of his left hip.  His right total hip arthroplasty appears well-seated and in good position.  His left hip joint space is still well-maintained and congruent.  I have not seen any significant changes in the joint space when reviewing all the previous films as well.  I would like to send him to my partner Dr. Shon Baton for a one-time intra-articular steroid injection in his left hip under radiographic guidance.  I hope this can be both therapeutic and diagnostic for the patient.  I would then like to see him back in 4 weeks to see how his responses been to that injection so we can then recommend other treatment options if needed.  We may end up needing to obtain an MRI of his left hip to better assess the cartilage if he does not have a good response to a steroid injection in his left hip.  I did still advocate weight loss for him.  All  questions and concerns were answered and addressed.

## 2022-08-23 ENCOUNTER — Ambulatory Visit: Payer: 59 | Admitting: Orthopaedic Surgery

## 2022-08-29 ENCOUNTER — Ambulatory Visit: Payer: 59 | Admitting: Orthopaedic Surgery

## 2022-08-29 ENCOUNTER — Encounter: Payer: Self-pay | Admitting: Orthopaedic Surgery

## 2022-08-29 DIAGNOSIS — M2550 Pain in unspecified joint: Secondary | ICD-10-CM

## 2022-08-29 DIAGNOSIS — M25552 Pain in left hip: Secondary | ICD-10-CM

## 2022-08-29 DIAGNOSIS — Z96641 Presence of right artificial hip joint: Secondary | ICD-10-CM

## 2022-08-29 MED ORDER — CELECOXIB 200 MG PO CAPS: 200.0000 mg | ORAL_CAPSULE | Freq: Two times a day (BID) | ORAL | 1 refills | Status: DC | PRN

## 2022-08-29 NOTE — Progress Notes (Signed)
The patient is well-known to me.  We replaced his right hip remotely.  He is significantly obese but has lost 8 pounds within the last time when I saw him just at the end of November of this past year.  At that visit he was having left hip pain and we sent him for an intra-articular injection in that left hip with Dr. Rolena Infante under ultrasound guidance since his x-rays showed a well-maintained joint space.  He says it took about 3 weeks for the injection to help but it does help.  However since then he keeps getting intermittent swelling in most of his joints and is really affecting his mobility and his actives of daily living.  He actually showed me photographs of his left knee and right ankle recently that did show significant swelling.  On exam today neither knee or ankle has any swelling but there certainly painful.  His left hip moves smoothly and fluidly but I did watch him walk and he walks with a slight limp.  He is concerned about rheumatologic issues.  I do feel it is appropriate to obtain a rheumatoid panel given his intermittent swelling in his joints that come and go within a short amount of time and this is multiple joints.  Will see him back in a week to go over those results as well as to obtain at least a standing x-ray of his left knee that we will show the AP view of the right knee as well.  He may end up needing referral to rheumatologist.  I will start Celebrex as an anti-inflammatory as well.  He agrees with this treatment plan.

## 2022-08-29 NOTE — Addendum Note (Signed)
Addended by: Shelle Iron on: 08/29/2022 04:06 PM   Modules accepted: Orders

## 2022-08-30 LAB — RHEUMATOID FACTOR: Rheumatoid fact SerPl-aCnc: <14 IU/mL (ref <14)

## 2022-08-30 LAB — ANA: Anti Nuclear Antibody (ANA): NEGATIVE

## 2022-08-30 LAB — URIC ACID: Uric Acid, Serum: 8.3 mg/dL — ABNORMAL HIGH (ref 4.0–8.0)

## 2022-08-30 LAB — SEDIMENTATION RATE: Sed Rate: 36 mm/h — ABNORMAL HIGH (ref 0–20)

## 2022-09-02 ENCOUNTER — Encounter: Payer: Self-pay | Admitting: Orthopaedic Surgery

## 2022-09-02 DIAGNOSIS — Z96641 Presence of right artificial hip joint: Secondary | ICD-10-CM

## 2022-09-05 ENCOUNTER — Other Ambulatory Visit: Payer: Self-pay

## 2022-09-05 DIAGNOSIS — M25552 Pain in left hip: Secondary | ICD-10-CM

## 2022-09-05 DIAGNOSIS — M2550 Pain in unspecified joint: Secondary | ICD-10-CM

## 2022-09-23 ENCOUNTER — Other Ambulatory Visit: Payer: Self-pay

## 2022-09-23 ENCOUNTER — Emergency Department (HOSPITAL_BASED_OUTPATIENT_CLINIC_OR_DEPARTMENT_OTHER)
Admission: EM | Admit: 2022-09-23 | Discharge: 2022-09-23 | Disposition: A | Discharge status: Home / Self Care | Payer: No Typology Code available for payment source | Attending: Emergency Medicine | Admitting: Emergency Medicine

## 2022-09-23 DIAGNOSIS — T148XXA Other injury of unspecified body region, initial encounter: Secondary | ICD-10-CM

## 2022-09-23 DIAGNOSIS — Z7984 Long term (current) use of oral hypoglycemic drugs: Secondary | ICD-10-CM | POA: Diagnosis not present

## 2022-09-23 DIAGNOSIS — Z7982 Long term (current) use of aspirin: Secondary | ICD-10-CM | POA: Insufficient documentation

## 2022-09-23 DIAGNOSIS — S0990XA Unspecified injury of head, initial encounter: Secondary | ICD-10-CM | POA: Diagnosis present

## 2022-09-23 DIAGNOSIS — S80212A Abrasion, left knee, initial encounter: Secondary | ICD-10-CM | POA: Insufficient documentation

## 2022-09-23 DIAGNOSIS — W19XXXA Unspecified fall, initial encounter: Secondary | ICD-10-CM

## 2022-09-23 DIAGNOSIS — W010XXA Fall on same level from slipping, tripping and stumbling without subsequent striking against object, initial encounter: Secondary | ICD-10-CM | POA: Insufficient documentation

## 2022-09-23 DIAGNOSIS — M25562 Pain in left knee: Secondary | ICD-10-CM

## 2022-09-23 DIAGNOSIS — M549 Dorsalgia, unspecified: Secondary | ICD-10-CM | POA: Insufficient documentation

## 2022-09-23 DIAGNOSIS — M542 Cervicalgia: Secondary | ICD-10-CM | POA: Diagnosis not present

## 2022-09-23 NOTE — ED Provider Notes (Signed)
[name] [name] Provider Note   CSN: [id number] Arrival date & time: 09/23/22  1853     History {Add pertinent medical, surgical, social history, OB history to HPI:1} Chief Complaint  Patient presents with   [name] [name] is a 57 y.o. male.  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  Did strike his head on a Car and had some neck pain and back pain afterwards.  Says that he struck his left knee and left elbow on the way down.  Reports that he did not lose consciousness.  Not on AC.  No nausea or vomiting or headache afterwards.  Says that he has diffuse muscle pain.  Has been able to be ambulatory since the event.        Home Medications Prior to Admission medications   Medication Sig Start Date End Date Taking? Authorizing Provider  acetaminophen (TYLENOL) 650 MG CR tablet Take 1,300 mg by mouth 2 (two) times daily. Hip pain.    [provider]  albuterol (PROVENTIL HFA;VENTOLIN HFA) 108 (90 BASE) MCG/ACT inhaler Inhale 1-2 puffs into the lungs every 6 (six) hours as needed for wheezing or shortness of breath.    [provider]  aspirin 81 MG chewable tablet Chew 1 tablet (81 mg total) by mouth 2 (two) times daily. 08/08/18   Mcarthur Rossetti, MD  carvedilol (COREG) 12.5 MG tablet Take 12.5 mg by mouth 2 (two) times daily. 07/16/18   [provider]  celecoxib (CELEBREX) 200 MG capsule Take 1 capsule (200 mg total) by mouth 2 (two) times daily between meals as needed. 08/29/22   Mcarthur Rossetti, MD  citalopram (CELEXA) 40 MG tablet Take 40 mg by mouth every evening. 07/18/18   [provider]  diphenhydrAMINE (BENADRYL) 25 MG tablet Take 50 mg by mouth at bedtime.    [provider]  folic acid (FOLVITE) 1 MG tablet Take 1 mg by mouth daily.    [provider]  hydrochlorothiazide (HYDRODIURIL) 25  MG tablet Take 25 mg by mouth daily. 07/18/18   [provider]  ibuprofen (ADVIL,MOTRIN) 200 MG tablet Take 600 mg by mouth 2 (two) times daily. Hip pain.    [provider]  LORazepam (ATIVAN) 1 MG tablet Take 1 tablet (1 mg total) by mouth daily as needed for anxiety. 08/22/18   Mcarthur Rossetti, MD  Melatonin 10 MG TABS Take 10 mg by mouth at bedtime.    [provider]  metFORMIN (GLUCOPHAGE-XR) 500 MG 24 hr tablet Take 1,000 mg by mouth 2 (two) times daily. 07/10/18   [provider]  methocarbamol (ROBAXIN) 500 MG tablet Take 1 tablet (500 mg total) by mouth every 6 (six) hours as needed for muscle spasms. 11/21/18   Mcarthur Rossetti, MD  montelukast (SINGULAIR) 10 MG tablet Take 10 mg by mouth daily.     [provider]  oxyCODONE (OXY IR/ROXICODONE) 5 MG immediate release tablet Take 1-2 tablets (5-10 mg total) by mouth every 6 (six) hours as needed for moderate pain (pain score 4-6). 11/05/18   Mcarthur Rossetti, MD  quinapril (ACCUPRIL) 40 MG tablet Take 20 mg by mouth daily.     [provider]  testosterone cypionate (DEPOTESTOSTERONE CYPIONATE) 200 MG/ML injection Inject 1 mL into the muscle every 14 (fourteen) days. 04/24/18   [provider]  Allergies    Simvastatin and Codeine    Review of Systems   Review of Systems  Physical Exam Updated Vital Signs BP (!) 162/93 (BP Location: Right Arm)   Pulse 76   Temp 98.4 F (36.9 C) (Oral)   Resp 20   Ht 6' 2"$  (1.88 m)   Wt (!) 147.4 kg   SpO2 97%   BMI 41.73 kg/m  Physical Exam Vitals and nursing note reviewed.  Constitutional:      General: He is not in acute distress.    Appearance: Normal appearance. He is well-developed. He is not ill-appearing.  HENT:     Head: Normocephalic and atraumatic.     Right Ear: Tympanic membrane, ear canal and external ear normal.     Left Ear: Tympanic membrane, ear canal and external ear normal.     Nose:  Nose normal.     Mouth/Throat:     Mouth: Mucous membranes are moist.     Pharynx: Oropharynx is clear.  Eyes:     Extraocular Movements: Extraocular movements intact.     Conjunctiva/sclera: Conjunctivae normal.     Pupils: Pupils are equal, round, and reactive to light.  Neck:     Comments: No C-spine midline tenderness to palpation Cardiovascular:     Rate and Rhythm: Normal rate and regular rhythm.     Pulses: Normal pulses.     Heart sounds: Normal heart sounds.  Pulmonary:     Effort: Pulmonary effort is normal. No respiratory distress.     Breath sounds: Normal breath sounds.  Abdominal:     General: Abdomen is flat. Bowel sounds are normal. There is no distension.     Palpations: Abdomen is soft. There is no mass.     Tenderness: There is no abdominal tenderness. There is no guarding.  Musculoskeletal:        General: No deformity. Normal range of motion.     Cervical back: Normal range of motion and neck supple. No rigidity or tenderness.     Right lower leg: No edema.     Left lower leg: No edema.     Comments: No tenderness to palpation of midline thoracic or lumbar spine.  No step-offs palpated.  No tenderness to palpation of chest wall.  No bruising noted.  No tenderness to palpation of bilateral clavicles.  No tenderness to palpation, bruising, or deformities noted of bilateral shoulders, elbows, wrists, hips, or ankles. Abrasion and hematoma to L anterior patella.   Skin:    General: Skin is warm and dry.  Neurological:     General: No focal deficit present.     Mental Status: He is alert and oriented to person, place, and time. Mental status is at baseline.     Cranial Nerves: No cranial nerve deficit.     Motor: No weakness.  Psychiatric:        Mood and Affect: Mood normal.        Behavior: Behavior normal.     ED Results / Procedures / Treatments   Labs (all labs ordered are listed, but only abnormal results are displayed) Labs Reviewed - No data to  display  EKG None  Radiology No results found.  Procedures Procedures  {Document cardiac monitor, telemetry assessment procedure when appropriate:1}  Medications Ordered in ED Medications - No data to display  ED Course/ Medical Decision Making/ A&P   {   Click here for ABCD2, HEART and other calculatorsREFRESH Note before signing :1}  Medical Decision Making  ***  {Document critical care time when appropriate:1} {Document review of labs and clinical decision tools ie heart score, Chads2Vasc2 etc:1}  {Document your independent review of radiology images, and any outside records:1} {Document your discussion with family members, caretakers, and with consultants:1} {Document social determinants of health affecting pt's care:1} {Document your decision making why or why not admission, treatments were needed:1} Final Clinical Impression(s) / ED Diagnoses Final diagnoses:  Fall, initial encounter  Injury of head, initial encounter  Abrasion  Acute pain of left knee    Rx / DC Orders ED Discharge Orders     None

## 2022-09-23 NOTE — Discharge Instructions (Signed)
You were seen for fall in the emergency department.   At home, please take Tylenol and ibuprofen for your pain.  Use ice for your knee.  You may also use muscle relaxers over the next few days for your pain.  Follow-up with your primary doctor in 2-3 days regarding your visit if your symptoms persist.  If you do not have a primary doctor you can follow-up with our team.  Return immediately to the emergency department if you experience any of the following: Worsening pain, inability to walk, or any other concerning symptoms.    Thank you for visiting our Emergency Department. It was a pleasure taking care of you today.

## 2022-09-23 NOTE — ED Notes (Signed)
Pt. Had a fall this night into a police car hitting his forehead on the police car causing a knot on the forehead with no LOC   Pt. Also hit the L elbow causing an abrasion with a small amt of torn skin.  Pt. Also fell onto the L knee hitting his knee on the road Asphalt causing his L knee to be Red with abrasion and warm to touch with moderate edema and the Pt. Complains of pain.  Pt. Also complains of his neck and his upper back being sore.  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

## 2022-09-23 NOTE — ED Triage Notes (Signed)
Patient presents to ED from work. Patient is a Land. Patient tripped, fell and "face planted" into a police car. Reports pain to neck, back, bilateral shoulders and left knee. Denies LOC or taking blood thinners.

## 2022-10-13 ENCOUNTER — Other Ambulatory Visit: Payer: Self-pay | Admitting: Orthopaedic Surgery

## 2022-10-24 ENCOUNTER — Other Ambulatory Visit: Payer: Self-pay

## 2022-10-24 DIAGNOSIS — M25552 Pain in left hip: Secondary | ICD-10-CM

## 2022-10-31 ENCOUNTER — Telehealth: Payer: Self-pay | Admitting: Orthopaedic Surgery

## 2022-10-31 NOTE — Telephone Encounter (Signed)
[employer] faxed some forms in. May be duplicate forms

## 2022-10-31 NOTE — Telephone Encounter (Signed)
Patient last seen 08/29/22. has submitted STD [employer] forms. Please advise work status. Forms to [employer].

## 2022-10-31 NOTE — Telephone Encounter (Signed)
Ok, I have some completed in the outbox down here, may be duplicate?

## 2022-11-13 ENCOUNTER — Ambulatory Visit
Admission: RE | Admit: 2022-11-13 | Discharge: 2022-11-13 | Disposition: A | Discharge status: Home / Self Care | Payer: 59 | Source: Ambulatory Visit | Attending: Physician Assistant | Admitting: Physician Assistant

## 2022-11-13 ENCOUNTER — Ambulatory Visit
Admission: RE | Admit: 2022-11-13 | Discharge: 2022-11-13 | Disposition: A | Discharge status: Home / Self Care | Payer: 59 | Source: Ambulatory Visit | Attending: Orthopaedic Surgery | Admitting: Orthopaedic Surgery

## 2022-11-13 DIAGNOSIS — Z96641 Presence of right artificial hip joint: Secondary | ICD-10-CM

## 2022-11-13 DIAGNOSIS — M25552 Pain in left hip: Secondary | ICD-10-CM

## 2022-11-16 ENCOUNTER — Telehealth: Payer: Self-pay | Admitting: Orthopaedic Surgery

## 2022-11-16 NOTE — Telephone Encounter (Signed)
LM with patient to let me know

## 2022-11-16 NOTE — Telephone Encounter (Signed)
New [employer] forms have been received. Please advise work status. Forms to [employer].

## 2022-11-17 NOTE — Telephone Encounter (Signed)
Noted for [employer]. 

## 2022-11-21 ENCOUNTER — Encounter: Payer: Self-pay | Admitting: Orthopaedic Surgery

## 2022-11-21 ENCOUNTER — Ambulatory Visit: Payer: 59 | Admitting: Orthopaedic Surgery

## 2022-11-21 DIAGNOSIS — M1612 Unilateral primary osteoarthritis, left hip: Secondary | ICD-10-CM

## 2022-11-21 DIAGNOSIS — M25552 Pain in left hip: Secondary | ICD-10-CM

## 2022-11-21 NOTE — Progress Notes (Signed)
The patient is well-known to me.  He is returning for follow-up after having a MRI of his lumbar spine and his left hip.  We replaced his right hip successfully in 2019.  He has been doing well with his left hip and I saw him several months ago and with multiple joint complaints that sent him to a rheumatologist and that was very helpful.  Even Celebrex was helpful.  Unfortunately he then sustained an acute work-related injury involving a suspect and he got his legs clipped out from under him and he landed hard on his left hip.  He had significant left hip and low back pain since then.  This was new in onset.  His previous x-rays showed only some mild arthritic changes in his left hip.  He was previously just having some mild left hip pain that was getting slowly worse but I can move his hip around easily.  After this hard mechanical fall his hip exam is rapidly worsening.  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  There is edema in the femoral head and acetabulum as well as femoral neck.  There is a large joint effusion as well and arthritic changes in that left hip.  His lumbar spine MRI shows some degenerative changes in the lumbar spine but no acute findings.  There is no nerve compression.  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  At this point given the significant findings on his left hip MRI, I am recommending a left total hip arthroplasty.  He is continuing to offload his hip using a walker and taking Celebrex.  I need him out of work completely given the nature of this injury.  Even sedentary work is not warranted.  He understands my recommendation for surgery.  I discussed the risk and benefits of this as well.  Again, based on the MRI findings and his presentation, this is definitely an acute injury that was related to what he describes in terms of the injury that occurred at work.  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  All questions and concerns were answered addressed.  I gave him a note to keep him out of work until further notice we will work on getting scheduled for a left hip replacement.  He will continue to work on blood glucose control as best as he can.

## 2022-11-26 ENCOUNTER — Encounter: Payer: Self-pay | Admitting: Orthopaedic Surgery

## 2022-11-28 ENCOUNTER — Other Ambulatory Visit: Payer: Self-pay | Admitting: Orthopaedic Surgery

## 2022-11-28 MED ORDER — ACETAMINOPHEN-CODEINE 300-30 MG PO TABS: 1.0000 | ORAL_TABLET | Freq: Three times a day (TID) | ORAL | 0 refills | Status: DC | PRN

## 2022-11-28 MED ORDER — CELECOXIB 200 MG PO CAPS: 200.0000 mg | ORAL_CAPSULE | Freq: Two times a day (BID) | ORAL | 1 refills | Status: DC | PRN

## 2022-12-01 ENCOUNTER — Encounter: Payer: Self-pay | Admitting: Orthopaedic Surgery

## 2022-12-09 NOTE — Telephone Encounter (Signed)
Called patient and scheduled surgery

## 2022-12-13 ENCOUNTER — Other Ambulatory Visit: Payer: Self-pay

## 2022-12-15 NOTE — Patient Instructions (Signed)
DUE TO COVID-19 ONLY TWO VISITORS  (aged 57 and older)  ARE ALLOWED TO COME WITH YOU AND STAY IN THE WAITING ROOM ONLY DURING PRE OP AND PROCEDURE.   **NO VISITORS ARE ALLOWED IN THE SHORT STAY AREA OR RECOVERY ROOM!!**  IF YOU WILL BE ADMITTED INTO THE HOSPITAL YOU ARE ALLOWED ONLY FOUR SUPPORT PEOPLE DURING VISITATION HOURS ONLY (7 AM -8PM)   The support person(s) must pass our screening, gel in and out, and wear a mask at all times, including in the patient's room. Patients must also wear a mask when staff or their support person are in the room. Visitors GUEST BADGE MUST BE WORN VISIBLY  One adult visitor may remain with you overnight and MUST be in the room by 8 P.M.     Your procedure is scheduled on: 12/23/22   Report to [facility] Main Entrance    Report to admitting at : 9:00 AM   Call this number if you have problems the morning of surgery [phone]   Do not eat food :After Midnight.   After Midnight you may have the following liquids until : 8:30 AM DAY OF SURGERY  Water Black Coffee (sugar ok, NO MILK/CREAM OR CREAMERS)  Tea (sugar ok, NO MILK/CREAM OR CREAMERS) regular and decaf                             Plain Jell-O (NO RED)                                           Fruit ices (not with fruit pulp, NO RED)                                     Popsicles (NO RED)                                                                  Juice: apple, WHITE grape, WHITE cranberry Sports drinks like Gatorade (NO RED)   The day of surgery:  Drink ONE (1) Pre-Surgery Clear G2 at : 8:30 AM the morning of surgery. Drink in one sitting. Do not sip.  This drink was given to you during your hospital  pre-op appointment visit. Nothing else to drink after completing the  Pre-Surgery Clear Ensure or G2.          If you have questions, please contact your surgeon's office.   Oral Hygiene is also important to reduce your risk of infection.                                     Remember - BRUSH YOUR TEETH THE MORNING OF SURGERY WITH YOUR REGULAR TOOTHPASTE  DENTURES WILL BE REMOVED PRIOR TO SURGERY PLEASE DO NOT APPLY "Poly grip" OR ADHESIVES!!!   Do NOT smoke after Midnight   Take these medicines the morning of surgery with A SIP OF WATER: montelukas,citalopram,escitalopram,carvedilol.  How to Manage Your Diabetes Before  and After Surgery  Why is it important to control my blood sugar before and after surgery? Improving blood sugar levels before and after surgery helps healing and can limit problems. A way of improving blood sugar control is eating a healthy diet by:  Eating less sugar and carbohydrates  Increasing activity/exercise  Talking with your doctor about reaching your blood sugar goals High blood sugars (greater than 180 mg/dL) can raise your risk of infections and slow your recovery, so you will need to focus on controlling your diabetes during the weeks before surgery. Make sure that the doctor who takes care of your diabetes knows about your planned surgery including the date and location.  How do I manage my blood sugar before surgery? Check your blood sugar at least 4 times a day, starting 2 days before surgery, to make sure that the level is not too high or low. Check your blood sugar the morning of your surgery when you wake up and every 2 hours until you get to the Short Stay unit. If your blood sugar is less than 70 mg/dL, you will need to treat for low blood sugar: Do not take insulin. Treat a low blood sugar (less than 70 mg/dL) with  cup of clear juice (cranberry or apple), 4 glucose tablets, OR glucose gel. Recheck blood sugar in 15 minutes after treatment (to make sure it is greater than 70 mg/dL). If your blood sugar is not greater than 70 mg/dL on recheck, call [phone] for further instructions. Report your blood sugar to the short stay nurse when you get to Short Stay.  If you are admitted to the hospital after surgery: Your  blood sugar will be checked by the staff and you will probably be given insulin after surgery (instead of oral diabetes medicines) to make sure you have good blood sugar levels. The goal for blood sugar control after surgery is 80-180 mg/dL.  WHAT DO I DO ABOUT MY DIABETES MEDICATION?  Hold Jardiance 3 days before surgery.Last day: 12/19/22  THE DAY BEFORE SURGERY, take metformin and actos as usual .    THE MORNING OF SURGERY, DO NOT TAKE ANY ORAL DIABETIC MEDICATIONS DAY OF YOUR SURGERY DO NOT TAKE THE FOLLOWING 7 DAYS PRIOR TO SURGERY: Ozempic, Wegovy, Rybelsus (Semaglutide), Byetta (exenatide), Bydureon (exenatide ER), Victoza, Saxenda (liraglutide), or Trulicity (dulaglutide) Mounjaro (Tirzepatide) Adlyxin (Lixisenatide), Polyethylene Glycol Loxenatide.  Bring CPAP mask and tubing day of surgery.                              You may not have any metal on your body including hair pins, jewelry, and body piercing             Do not wear lotions, powders, perfumes/cologne, or deodorant              Men may shave face and neck.   Do not bring valuables to the hospital. [facility] IS NOT             RESPONSIBLE   FOR VALUABLES.   Contacts, glasses, or bridgework may not be worn into surgery.   Bring small overnight bag day of surgery.   DO NOT BRING YOUR HOME MEDICATIONS TO THE HOSPITAL. PHARMACY WILL DISPENSE MEDICATIONS LISTED ON YOUR MEDICATION LIST TO YOU DURING YOUR ADMISSION IN THE HOSPITAL!    Patients discharged on the day of surgery will not be allowed to drive home.  Someone NEEDS  to stay with you for the first 24 hours after anesthesia.   Special Instructions: Bring a copy of your healthcare power of attorney and living will documents         the day of surgery if you haven't scanned them before.              Please read over the following fact sheets you were given: IF YOU HAVE QUESTIONS ABOUT YOUR PRE-OP INSTRUCTIONS PLEASE CALL [phone]      Incentive  Spirometer  An incentive spirometer is a tool that can help keep your lungs clear and active. This tool measures how well you are filling your lungs with each breath. Taking long deep breaths may help reverse or decrease the chance of developing breathing (pulmonary) problems (especially infection) following: A long period of time when you are unable to move or be active. BEFORE THE PROCEDURE  If the spirometer includes an indicator to show your best effort, your nurse or respiratory therapist will set it to a desired goal. If possible, sit up straight or lean slightly forward. Try not to slouch. Hold the incentive spirometer in an upright position. INSTRUCTIONS FOR USE  Sit on the edge of your bed if possible, or sit up as far as you can in bed or on a chair. Hold the incentive spirometer in an upright position. Breathe out normally. Place the mouthpiece in your mouth and seal your lips tightly around it. Breathe in slowly and as deeply as possible, raising the piston or the ball toward the top of the column. Hold your breath for 3-5 seconds or for as long as possible. Allow the piston or ball to fall to the bottom of the column. Remove the mouthpiece from your mouth and breathe out normally. Rest for a few seconds and repeat Steps 1 through 7 at least 10 times every 1-2 hours when you are awake. Take your time and take a few normal breaths between deep breaths. The spirometer may include an indicator to show your best effort. Use the indicator as a goal to work toward during each repetition. After each set of 10 deep breaths, practice coughing to be sure your lungs are clear. If you have an incision (the cut made at the time of surgery), support your incision when coughing by placing a pillow or rolled up towels firmly against it. Once you are able to get out of bed, walk around indoors and cough well. You may stop using the incentive spirometer when instructed by your caregiver.  RISKS AND  COMPLICATIONS Take your time so you do not get dizzy or light-headed. If you are in pain, you may need to take or ask for pain medication before doing incentive spirometry. It is harder to take a deep breath if you are having pain. AFTER USE Rest and breathe slowly and easily. It can be helpful to keep track of a log of your progress. Your caregiver can provide you with a simple table to help with this. If you are using the spirometer at home, follow these instructions: SEEK MEDICAL CARE IF:  You are having difficultly using the spirometer. You have trouble using the spirometer as often as instructed. Your pain medication is not giving enough relief while using the spirometer. You develop fever of 100.5 F (38.1 C) or higher. SEEK IMMEDIATE MEDICAL CARE IF:  You cough up bloody sputum that had not been present before. You develop fever of 102 F (38.9 C) or greater. You develop worsening  pain at or near the incision site. MAKE SURE YOU:  Understand these instructions. Will watch your condition. Will get help right away if you are not doing well or get worse. Document Released: 12/12/2006 Document Revised: 10/24/2011 Document Reviewed: 02/12/2007 [facility] Patient Information 2014 [place], [place].   ________________________________________________________________________

## 2022-12-16 ENCOUNTER — Other Ambulatory Visit: Payer: Self-pay

## 2022-12-16 ENCOUNTER — Encounter (HOSPITAL_COMMUNITY)
Admission: RE | Admit: 2022-12-16 | Discharge: 2022-12-16 | Disposition: A | Discharge status: Home / Self Care | Payer: 59 | Source: Ambulatory Visit | Attending: Orthopaedic Surgery | Admitting: Orthopaedic Surgery

## 2022-12-16 ENCOUNTER — Encounter (HOSPITAL_COMMUNITY): Payer: Self-pay

## 2022-12-16 VITALS — BP 158/99 | HR 79 | Temp 98.6°F | Ht 74.0 in | Wt 343.0 lb

## 2022-12-16 DIAGNOSIS — Z01818 Encounter for other preprocedural examination: Secondary | ICD-10-CM | POA: Insufficient documentation

## 2022-12-16 DIAGNOSIS — E119 Type 2 diabetes mellitus without complications: Secondary | ICD-10-CM | POA: Insufficient documentation

## 2022-12-16 DIAGNOSIS — I2510 Atherosclerotic heart disease of native coronary artery without angina pectoris: Secondary | ICD-10-CM

## 2022-12-16 HISTORY — DX: Unspecified osteoarthritis, unspecified site: M19.90

## 2022-12-16 LAB — CBC
HCT: 51.7 % (ref 39.0–52.0)
Hemoglobin: 17.5 g/dL — ABNORMAL HIGH (ref 13.0–17.0)
MCH: 33.1 pg (ref 26.0–34.0)
MCHC: 33.8 g/dL (ref 30.0–36.0)
MCV: 97.7 fL (ref 80.0–100.0)
Platelets: 172 10*3/uL (ref 150–400)
RBC: 5.29 MIL/uL (ref 4.22–5.81)
RDW: 14.6 % (ref 11.5–15.5)
WBC: 8.9 10*3/uL (ref 4.0–10.5)
nRBC: 0 % (ref 0.0–0.2)

## 2022-12-16 LAB — SURGICAL PCR SCREEN
MRSA, PCR: NEGATIVE
Staphylococcus aureus: NEGATIVE

## 2022-12-16 LAB — BASIC METABOLIC PANEL
Anion gap: 10 (ref 5–15)
BUN: 19 mg/dL (ref 6–20)
CO2: 24 mmol/L (ref 22–32)
Calcium: 9.5 mg/dL (ref 8.9–10.3)
Chloride: 101 mmol/L (ref 98–111)
Creatinine, Ser: 0.9 mg/dL (ref 0.61–1.24)
GFR, Estimated: >60 mL/min (ref >60)
Glucose, Bld: 154 mg/dL — ABNORMAL HIGH (ref 70–99)
Potassium: 3.7 mmol/L (ref 3.5–5.1)
Sodium: 135 mmol/L (ref 135–145)

## 2022-12-16 LAB — TYPE AND SCREEN: ABO/RH(D): A POS

## 2022-12-16 LAB — HEMOGLOBIN A1C
Hgb A1c MFr Bld: 6.6 % — ABNORMAL HIGH (ref 4.8–5.6)
Mean Plasma Glucose: 142.72 mg/dL

## 2022-12-16 LAB — GLUCOSE, CAPILLARY: Glucose-Capillary: 212 mg/dL — ABNORMAL HIGH (ref 70–99)

## 2022-12-16 NOTE — Progress Notes (Signed)
For Short Stay: COVID SWAB appointment date:  Bowel Prep reminder:   For Anesthesia: PCP - Dr. Cheree Ditto: [facility] Cardiologist - N/A  Chest x-ray -  EKG -  Stress Test -  ECHO -  Cardiac Cath -  Pacemaker/ICD device last checked: Pacemaker orders received: Device Rep notified:  Spinal Cord Stimulator: N/A  Sleep Study - Yes CPAP - Yes  Fasting Blood Sugar - N/A Checks Blood Sugar __0___ times a day Date and result of last Hgb A1c- 7.5: 06/06/22  Last dose of GLP1 agonist- N/A GLP1 instructions:   Last dose of SGLT-2 inhibitors- Jardiance SGLT-2 instructions: To hold it 3 days. Last dose: 12/19/22  Blood Thinner Instructions: Aspirin Instructions: To hold 5 days before surgery. Last Dose:  Activity level: Can go up a flight of stairs and activities of daily living without stopping and without chest pain and/or shortness of breath   Able to exercise without chest pain and/or shortness of breath  Anesthesia review: Hx: HTN,DIA,Smoker,OSA(CPAP)  Patient denies shortness of breath, fever, cough and chest pain at PAT appointment   Patient verbalized understanding of instructions that were given to them at the PAT appointment. Patient was also instructed that they will need to review over the PAT instructions again at home before surgery.

## 2022-12-20 ENCOUNTER — Encounter: Payer: Self-pay | Admitting: Orthopaedic Surgery

## 2022-12-22 DIAGNOSIS — M1612 Unilateral primary osteoarthritis, left hip: Secondary | ICD-10-CM | POA: Insufficient documentation

## 2022-12-22 NOTE — H&P (Signed)
TOTAL HIP ADMISSION H&P  Patient is admitted for left total hip arthroplasty.  Subjective:  Chief Complaint: left hip pain  HPI: [name] [name], 57 y.o. male, has a history of pain and functional disability in the left hip(s) due to arthritis and patient has failed non-surgical conservative treatments for greater than 12 weeks to include NSAID's and/or analgesics, corticosteriod injections, weight reduction as appropriate, and activity modification.  Onset of symptoms was gradual starting 2 years ago with rapidlly worsening course since that time.The patient noted no past surgery on the left hip(s).  Patient currently rates pain in the left hip at 10 out of 10 with activity. Patient has night pain, worsening of pain with activity and weight bearing, trendelenberg gait, pain that interfers with activities of daily living, and pain with passive range of motion. Patient has evidence of subchondral cysts, subchondral sclerosis, periarticular osteophytes, and joint space narrowing by imaging studies. This condition presents safety issues increasing the risk of falls.  There is no current active infection.  Patient Active Problem List   Diagnosis Date Noted   Unilateral primary osteoarthritis, left hip 12/22/2022   Status post total replacement of right hip 08/22/2018   Past Medical History:  Diagnosis Date   Allergy    Arthritis    Asthma    Diabetes mellitus without complication (HCC)    type 2   Hyperlipidemia    Hypertension    Sleep apnea    cpap    Past Surgical History:  Procedure Laterality Date   TOTAL HIP ARTHROPLASTY Right 08/07/2018   Procedure: RIGHT TOTAL HIP ARTHROPLASTY ANTERIOR APPROACH;  Surgeon: Kathryne Hitch, MD;  Location: [facility] OR;  Service: Orthopedics;  Laterality: Right;  Needs RNFA   WISDOM TOOTH EXTRACTION      No current facility-administered medications for this encounter.   Current Outpatient Medications  Medication Sig Dispense Refill Last Dose    albuterol (PROVENTIL HFA;VENTOLIN HFA) 108 (90 BASE) MCG/ACT inhaler Inhale 1-2 puffs into the lungs every 6 (six) hours as needed for wheezing or shortness of breath.      aspirin 81 MG chewable tablet Chew 1 tablet (81 mg total) by mouth 2 (two) times daily. (Patient taking differently: Chew 162 mg by mouth daily.) 30 tablet 0    carvedilol (COREG) 12.5 MG tablet Take 12.5 mg by mouth 2 (two) times daily.  11    celecoxib (CELEBREX) 200 MG capsule Take 1 capsule (200 mg total) by mouth 2 (two) times daily between meals as needed. (Patient taking differently: Take 200 mg by mouth 2 (two) times daily.) 60 capsule 1    citalopram (CELEXA) 40 MG tablet Take 40 mg by mouth every evening.  0    escitalopram (LEXAPRO) 10 MG tablet Take 10 mg by mouth daily.      ezetimibe (ZETIA) 10 MG tablet Take 10 mg by mouth daily.      hydrochlorothiazide (HYDRODIURIL) 25 MG tablet Take 25 mg by mouth daily.  0    JARDIANCE 25 MG TABS tablet Take 25 mg by mouth daily.      MELATONIN PO Take 40 mg by mouth at bedtime.      metFORMIN (GLUCOPHAGE-XR) 500 MG 24 hr tablet Take 1,000 mg by mouth 2 (two) times daily.  0    montelukast (SINGULAIR) 10 MG tablet Take 10 mg by mouth daily.       pioglitazone (ACTOS) 15 MG tablet Take 15 mg by mouth daily.      zolpidem (AMBIEN)  10 MG tablet Take 10 mg by mouth at bedtime as needed for sleep.      acetaminophen-codeine (TYLENOL #3) 300-30 MG tablet Take 1-2 tablets by mouth every 8 (eight) hours as needed for moderate pain. (Patient not taking: Reported on 12/14/2022) 30 tablet 0 Not Taking   Allergies  Allergen Reactions   Ace Inhibitors Swelling and Other (See Comments)    Angioedema    Simvastatin Other (See Comments)    myalgia   Codeine Itching    Social History   Tobacco Use   Smoking status: Every Day    Packs/day: 1    Types: Cigarettes   Smokeless tobacco: Never  Substance Use Topics   Alcohol use: Yes    Alcohol/week: 1.0 standard drink of alcohol     Types: 1 Cans of beer per week    Comment: daily    Family History  Problem Relation Age of Onset   Heart disease Father    Hyperlipidemia Father    Hypertension Father    Heart disease Brother    Cancer Sister      Review of Systems  Objective:  Physical Exam Vitals reviewed.  Constitutional:      Appearance: Normal appearance. He is obese.  HENT:     Head: Normocephalic and atraumatic.  Eyes:     Extraocular Movements: Extraocular movements intact.     Pupils: Pupils are equal, round, and reactive to light.  Cardiovascular:     Rate and Rhythm: Normal rate and regular rhythm.     Pulses: Normal pulses.  Pulmonary:     Effort: Pulmonary effort is normal.     Breath sounds: Normal breath sounds.  Abdominal:     Palpations: Abdomen is soft.  Musculoskeletal:     Cervical back: Normal range of motion and neck supple.     Left hip: Tenderness and bony tenderness present. Decreased range of motion. Decreased strength.  Neurological:     Mental Status: He is alert and oriented to person, place, and time.  Psychiatric:        Behavior: Behavior normal.     Vital signs in last 24 hours:    Labs:   Estimated body mass index is 44.04 kg/m as calculated from the following:   Height as of 12/16/22: 6\' 2"  (1.88 m).   Weight as of 12/16/22: 155.6 kg.   Imaging Review Plain radiographs demonstrate severe degenerative joint disease of the left hip(s). The bone quality appears to be excellent for age and reported activity level.      Assessment/Plan:  End stage arthritis, left hip(s)  The patient history, physical examination, clinical judgement of the provider and imaging studies are consistent with end stage degenerative joint disease of the left hip(s) and total hip arthroplasty is deemed medically necessary. The treatment options including medical management, injection therapy, arthroscopy and arthroplasty were discussed at length. The risks and benefits of total hip  arthroplasty were presented and reviewed. The risks due to aseptic loosening, infection, stiffness, dislocation/subluxation,  thromboembolic complications and other imponderables were discussed.  The patient acknowledged the explanation, agreed to proceed with the plan and consent was signed. Patient is being admitted for inpatient treatment for surgery, pain control, PT, OT, prophylactic antibiotics, VTE prophylaxis, progressive ambulation and ADL's and discharge planning.The patient is planning to be discharged home with home health services

## 2022-12-23 ENCOUNTER — Other Ambulatory Visit: Payer: Self-pay

## 2022-12-23 ENCOUNTER — Encounter (HOSPITAL_COMMUNITY)
Admission: RE | Disposition: A | Discharge status: Home Health | Payer: Self-pay | Source: Home / Self Care | Attending: Orthopaedic Surgery

## 2022-12-23 ENCOUNTER — Observation Stay (HOSPITAL_COMMUNITY): Payer: 59

## 2022-12-23 ENCOUNTER — Observation Stay (HOSPITAL_COMMUNITY)
Admission: RE | Admit: 2022-12-23 | Discharge: 2022-12-24 | Disposition: A | Discharge status: Home Health | Payer: 59 | Attending: Orthopaedic Surgery | Admitting: Orthopaedic Surgery

## 2022-12-23 ENCOUNTER — Ambulatory Visit (HOSPITAL_COMMUNITY): Payer: 59

## 2022-12-23 ENCOUNTER — Encounter (HOSPITAL_COMMUNITY): Payer: Self-pay | Admitting: Orthopaedic Surgery

## 2022-12-23 ENCOUNTER — Ambulatory Visit (HOSPITAL_COMMUNITY): Payer: 59 | Admitting: Anesthesiology

## 2022-12-23 ENCOUNTER — Ambulatory Visit (HOSPITAL_BASED_OUTPATIENT_CLINIC_OR_DEPARTMENT_OTHER): Payer: 59 | Admitting: Anesthesiology

## 2022-12-23 DIAGNOSIS — M87852 Other osteonecrosis, left femur: Secondary | ICD-10-CM | POA: Insufficient documentation

## 2022-12-23 DIAGNOSIS — Z96642 Presence of left artificial hip joint: Secondary | ICD-10-CM

## 2022-12-23 DIAGNOSIS — J45909 Unspecified asthma, uncomplicated: Secondary | ICD-10-CM | POA: Diagnosis not present

## 2022-12-23 DIAGNOSIS — F17210 Nicotine dependence, cigarettes, uncomplicated: Secondary | ICD-10-CM | POA: Insufficient documentation

## 2022-12-23 DIAGNOSIS — Z79899 Other long term (current) drug therapy: Secondary | ICD-10-CM | POA: Insufficient documentation

## 2022-12-23 DIAGNOSIS — I10 Essential (primary) hypertension: Secondary | ICD-10-CM | POA: Insufficient documentation

## 2022-12-23 DIAGNOSIS — Z7984 Long term (current) use of oral hypoglycemic drugs: Secondary | ICD-10-CM | POA: Diagnosis not present

## 2022-12-23 DIAGNOSIS — M879 Osteonecrosis, unspecified: Secondary | ICD-10-CM

## 2022-12-23 DIAGNOSIS — E119 Type 2 diabetes mellitus without complications: Secondary | ICD-10-CM | POA: Diagnosis not present

## 2022-12-23 DIAGNOSIS — M1612 Unilateral primary osteoarthritis, left hip: Secondary | ICD-10-CM | POA: Diagnosis not present

## 2022-12-23 DIAGNOSIS — Z96641 Presence of right artificial hip joint: Secondary | ICD-10-CM | POA: Insufficient documentation

## 2022-12-23 DIAGNOSIS — Z7982 Long term (current) use of aspirin: Secondary | ICD-10-CM | POA: Diagnosis not present

## 2022-12-23 HISTORY — PX: TOTAL HIP ARTHROPLASTY: SHX124

## 2022-12-23 LAB — GLUCOSE, CAPILLARY
Glucose-Capillary: 167 mg/dL — ABNORMAL HIGH (ref 70–99)
Glucose-Capillary: 91 mg/dL (ref 70–99)

## 2022-12-23 LAB — TYPE AND SCREEN
Antibody Screen: NEGATIVE
DAT, IgG: NEGATIVE

## 2022-12-23 LAB — ABO/RH: ABO/RH(D): A POS

## 2022-12-23 SURGERY — ARTHROPLASTY, HIP, TOTAL, ANTERIOR APPROACH
Anesthesia: Spinal | Site: Hip | Laterality: Left

## 2022-12-23 MED ORDER — PANTOPRAZOLE SODIUM 40 MG PO TBEC
40.0000 mg | DELAYED_RELEASE_TABLET | Freq: Every day | ORAL | Status: DC
  Administered 2022-12-23 – 2022-12-24 (×2): 40 mg via ORAL
  Filled 2022-12-23 (×2): qty 1

## 2022-12-23 MED ORDER — ONDANSETRON HCL 4 MG PO TABS: 4.0000 mg | ORAL_TABLET | Freq: Four times a day (QID) | ORAL | Status: DC | PRN

## 2022-12-23 MED ORDER — DIPHENHYDRAMINE HCL 12.5 MG/5ML PO ELIX: 12.5000 mg | ORAL_SOLUTION | ORAL | Status: DC | PRN

## 2022-12-23 MED ORDER — FENTANYL CITRATE (PF) 100 MCG/2ML IJ SOLN
INTRAMUSCULAR | Status: DC | PRN
  Administered 2022-12-23: 100 ug via INTRAVENOUS

## 2022-12-23 MED ORDER — BUPIVACAINE IN DEXTROSE 0.75-8.25 % IT SOLN
INTRATHECAL | Status: DC | PRN
  Administered 2022-12-23: 2 mL via INTRATHECAL

## 2022-12-23 MED ORDER — FENTANYL CITRATE PF 50 MCG/ML IJ SOSY
25.0000 ug | PREFILLED_SYRINGE | Freq: Once | INTRAMUSCULAR | Status: AC
  Administered 2022-12-23: 25 ug via INTRAVENOUS
  Filled 2022-12-23: qty 1

## 2022-12-23 MED ORDER — OXYCODONE HCL 5 MG PO TABS
5.0000 mg | ORAL_TABLET | ORAL | Status: DC | PRN
  Administered 2022-12-23: 10 mg via ORAL
  Administered 2022-12-24 (×2): 5 mg via ORAL
  Filled 2022-12-23 (×2): qty 2
  Filled 2022-12-23: qty 1

## 2022-12-23 MED ORDER — TRANEXAMIC ACID-NACL 1000-0.7 MG/100ML-% IV SOLN
1000.0000 mg | INTRAVENOUS | Status: AC
  Administered 2022-12-23: 1000 mg via INTRAVENOUS

## 2022-12-23 MED ORDER — METOCLOPRAMIDE HCL 5 MG/ML IJ SOLN: 5.0000 mg | Freq: Three times a day (TID) | INTRAMUSCULAR | Status: DC | PRN

## 2022-12-23 MED ORDER — PHENOL 1.4 % MT LIQD: 1.0000 | OROMUCOSAL | Status: DC | PRN

## 2022-12-23 MED ORDER — PHENYLEPHRINE HCL-NACL 20-0.9 MG/250ML-% IV SOLN
INTRAVENOUS | Status: AC
  Filled 2022-12-23: qty 250

## 2022-12-23 MED ORDER — PIOGLITAZONE HCL 15 MG PO TABS
15.0000 mg | ORAL_TABLET | Freq: Every day | ORAL | Status: DC
  Administered 2022-12-23 – 2022-12-24 (×2): 15 mg via ORAL
  Filled 2022-12-23 (×2): qty 1

## 2022-12-23 MED ORDER — HYDROMORPHONE HCL 1 MG/ML IJ SOLN
0.5000 mg | INTRAMUSCULAR | Status: DC | PRN
  Administered 2022-12-23 – 2022-12-24 (×3): 1 mg via INTRAVENOUS
  Filled 2022-12-23 (×3): qty 1

## 2022-12-23 MED ORDER — CARVEDILOL 12.5 MG PO TABS
12.5000 mg | ORAL_TABLET | Freq: Two times a day (BID) | ORAL | Status: DC
  Administered 2022-12-23 – 2022-12-24 (×2): 12.5 mg via ORAL
  Filled 2022-12-23 (×2): qty 1

## 2022-12-23 MED ORDER — ESCITALOPRAM OXALATE 10 MG PO TABS
10.0000 mg | ORAL_TABLET | Freq: Every day | ORAL | Status: DC
  Administered 2022-12-24: 10 mg via ORAL
  Filled 2022-12-23: qty 1

## 2022-12-23 MED ORDER — ALUM & MAG HYDROXIDE-SIMETH 200-200-20 MG/5ML PO SUSP: 30.0000 mL | ORAL | Status: DC | PRN

## 2022-12-23 MED ORDER — OXYCODONE HCL 5 MG PO TABS
10.0000 mg | ORAL_TABLET | ORAL | Status: DC | PRN
  Administered 2022-12-23: 15 mg via ORAL
  Administered 2022-12-24: 10 mg via ORAL
  Filled 2022-12-23: qty 3
  Filled 2022-12-23: qty 2

## 2022-12-23 MED ORDER — DOCUSATE SODIUM 100 MG PO CAPS
100.0000 mg | ORAL_CAPSULE | Freq: Two times a day (BID) | ORAL | Status: DC
  Filled 2022-12-23 (×2): qty 1

## 2022-12-23 MED ORDER — ALBUTEROL SULFATE HFA 108 (90 BASE) MCG/ACT IN AERS: 1.0000 | INHALATION_SPRAY | Freq: Four times a day (QID) | RESPIRATORY_TRACT | Status: DC | PRN

## 2022-12-23 MED ORDER — METOCLOPRAMIDE HCL 5 MG PO TABS: 5.0000 mg | ORAL_TABLET | Freq: Three times a day (TID) | ORAL | Status: DC | PRN

## 2022-12-23 MED ORDER — ONDANSETRON HCL 4 MG/2ML IJ SOLN
INTRAMUSCULAR | Status: AC
  Filled 2022-12-23: qty 2

## 2022-12-23 MED ORDER — ACETAMINOPHEN 500 MG PO TABS
1000.0000 mg | ORAL_TABLET | Freq: Once | ORAL | Status: AC
  Administered 2022-12-23: 1000 mg via ORAL
  Filled 2022-12-23: qty 2

## 2022-12-23 MED ORDER — ACETAMINOPHEN 325 MG PO TABS: 325.0000 mg | ORAL_TABLET | Freq: Four times a day (QID) | ORAL | Status: DC | PRN

## 2022-12-23 MED ORDER — PHENYLEPHRINE HCL-NACL 20-0.9 MG/250ML-% IV SOLN
INTRAVENOUS | Status: DC | PRN
  Administered 2022-12-23: 50 ug/min via INTRAVENOUS

## 2022-12-23 MED ORDER — CEFAZOLIN SODIUM 1 G IJ SOLR
INTRAMUSCULAR | Status: AC
  Filled 2022-12-23: qty 10

## 2022-12-23 MED ORDER — LACTATED RINGERS IV SOLN: INTRAVENOUS | Status: DC

## 2022-12-23 MED ORDER — MENTHOL 3 MG MT LOZG: 1.0000 | LOZENGE | OROMUCOSAL | Status: DC | PRN

## 2022-12-23 MED ORDER — PROPOFOL 10 MG/ML IV BOLUS
INTRAVENOUS | Status: DC | PRN
  Administered 2022-12-23: 20 mg via INTRAVENOUS

## 2022-12-23 MED ORDER — FENTANYL CITRATE (PF) 100 MCG/2ML IJ SOLN
INTRAMUSCULAR | Status: AC
  Filled 2022-12-23: qty 2

## 2022-12-23 MED ORDER — CHLORHEXIDINE GLUCONATE 0.12 % MT SOLN
15.0000 mL | Freq: Once | OROMUCOSAL | Status: AC
  Administered 2022-12-23: 15 mL via OROMUCOSAL

## 2022-12-23 MED ORDER — PROMETHAZINE HCL 25 MG/ML IJ SOLN: 6.2500 mg | INTRAMUSCULAR | Status: DC | PRN

## 2022-12-23 MED ORDER — METFORMIN HCL ER 500 MG PO TB24
1000.0000 mg | ORAL_TABLET | Freq: Two times a day (BID) | ORAL | Status: DC
  Administered 2022-12-23 – 2022-12-24 (×2): 1000 mg via ORAL
  Filled 2022-12-23 (×2): qty 2

## 2022-12-23 MED ORDER — CEFAZOLIN IN SODIUM CHLORIDE 3-0.9 GM/100ML-% IV SOLN
3.0000 g | INTRAVENOUS | Status: AC
  Administered 2022-12-23: 3 g via INTRAVENOUS
  Filled 2022-12-23: qty 100

## 2022-12-23 MED ORDER — AMISULPRIDE (ANTIEMETIC) 5 MG/2ML IV SOLN: 10.0000 mg | Freq: Once | INTRAVENOUS | Status: DC | PRN

## 2022-12-23 MED ORDER — EZETIMIBE 10 MG PO TABS
10.0000 mg | ORAL_TABLET | Freq: Every day | ORAL | Status: DC
  Administered 2022-12-23 – 2022-12-24 (×2): 10 mg via ORAL
  Filled 2022-12-23 (×2): qty 1

## 2022-12-23 MED ORDER — HYDROCHLOROTHIAZIDE 25 MG PO TABS
25.0000 mg | ORAL_TABLET | Freq: Every day | ORAL | Status: DC
  Administered 2022-12-23 – 2022-12-24 (×2): 25 mg via ORAL
  Filled 2022-12-23 (×2): qty 1

## 2022-12-23 MED ORDER — MIDAZOLAM HCL 5 MG/5ML IJ SOLN
INTRAMUSCULAR | Status: DC | PRN
  Administered 2022-12-23: 2 mg via INTRAVENOUS

## 2022-12-23 MED ORDER — ALBUTEROL SULFATE (2.5 MG/3ML) 0.083% IN NEBU: 2.5000 mg | INHALATION_SOLUTION | Freq: Four times a day (QID) | RESPIRATORY_TRACT | Status: DC | PRN

## 2022-12-23 MED ORDER — IPRATROPIUM-ALBUTEROL 0.5-2.5 (3) MG/3ML IN SOLN
3.0000 mL | RESPIRATORY_TRACT | Status: AC
  Administered 2022-12-23: 3 mL via RESPIRATORY_TRACT

## 2022-12-23 MED ORDER — SODIUM CHLORIDE 0.9 % IV SOLN: INTRAVENOUS | Status: DC

## 2022-12-23 MED ORDER — IPRATROPIUM-ALBUTEROL 0.5-2.5 (3) MG/3ML IN SOLN
RESPIRATORY_TRACT | Status: AC
  Filled 2022-12-23: qty 3

## 2022-12-23 MED ORDER — HYDROMORPHONE HCL 1 MG/ML IJ SOLN: 0.2500 mg | INTRAMUSCULAR | Status: DC | PRN

## 2022-12-23 MED ORDER — ONDANSETRON HCL 4 MG/2ML IJ SOLN: 4.0000 mg | Freq: Four times a day (QID) | INTRAMUSCULAR | Status: DC | PRN

## 2022-12-23 MED ORDER — ACETAMINOPHEN 10 MG/ML IV SOLN: 1000.0000 mg | Freq: Once | INTRAVENOUS | Status: DC | PRN

## 2022-12-23 MED ORDER — ORAL CARE MOUTH RINSE: 15.0000 mL | Freq: Once | OROMUCOSAL | Status: AC

## 2022-12-23 MED ORDER — PROPOFOL 500 MG/50ML IV EMUL
INTRAVENOUS | Status: DC | PRN
  Administered 2022-12-23: 100 ug/kg/min via INTRAVENOUS

## 2022-12-23 MED ORDER — ACETAMINOPHEN 160 MG/5ML PO SOLN: 325.0000 mg | Freq: Once | ORAL | Status: DC | PRN

## 2022-12-23 MED ORDER — POVIDONE-IODINE 10 % EX SWAB: 2.0000 | Freq: Once | CUTANEOUS | Status: DC

## 2022-12-23 MED ORDER — CITALOPRAM HYDROBROMIDE 20 MG PO TABS
40.0000 mg | ORAL_TABLET | Freq: Every evening | ORAL | Status: DC
  Administered 2022-12-23: 40 mg via ORAL
  Filled 2022-12-23: qty 2

## 2022-12-23 MED ORDER — PROPOFOL 1000 MG/100ML IV EMUL
INTRAVENOUS | Status: AC
  Filled 2022-12-23: qty 100

## 2022-12-23 MED ORDER — MEPERIDINE HCL 50 MG/ML IJ SOLN: 6.2500 mg | INTRAMUSCULAR | Status: DC | PRN

## 2022-12-23 MED ORDER — ACETAMINOPHEN 325 MG PO TABS: 325.0000 mg | ORAL_TABLET | Freq: Once | ORAL | Status: DC | PRN

## 2022-12-23 MED ORDER — POLYETHYLENE GLYCOL 3350 17 G PO PACK: 17.0000 g | PACK | Freq: Every day | ORAL | Status: DC | PRN

## 2022-12-23 MED ORDER — ZOLPIDEM TARTRATE 5 MG PO TABS: 5.0000 mg | ORAL_TABLET | Freq: Every evening | ORAL | Status: DC | PRN

## 2022-12-23 MED ORDER — TRANEXAMIC ACID-NACL 1000-0.7 MG/100ML-% IV SOLN
INTRAVENOUS | Status: AC
  Filled 2022-12-23: qty 100

## 2022-12-23 MED ORDER — SODIUM CHLORIDE 0.9 % IR SOLN
Status: DC | PRN
  Administered 2022-12-23: 1000 mL

## 2022-12-23 MED ORDER — ASPIRIN 81 MG PO CHEW
81.0000 mg | CHEWABLE_TABLET | Freq: Two times a day (BID) | ORAL | Status: DC
  Administered 2022-12-23 – 2022-12-24 (×2): 81 mg via ORAL
  Filled 2022-12-23 (×2): qty 1

## 2022-12-23 MED ORDER — MONTELUKAST SODIUM 10 MG PO TABS
10.0000 mg | ORAL_TABLET | Freq: Every day | ORAL | Status: DC
  Administered 2022-12-23 – 2022-12-24 (×2): 10 mg via ORAL
  Filled 2022-12-23 (×2): qty 1

## 2022-12-23 MED ORDER — METHOCARBAMOL 1000 MG/10ML IJ SOLN: 500.0000 mg | Freq: Four times a day (QID) | INTRAVENOUS | Status: DC | PRN

## 2022-12-23 MED ORDER — METHOCARBAMOL 500 MG PO TABS
500.0000 mg | ORAL_TABLET | Freq: Four times a day (QID) | ORAL | Status: DC | PRN
  Administered 2022-12-23 – 2022-12-24 (×3): 500 mg via ORAL
  Filled 2022-12-23 (×3): qty 1

## 2022-12-23 MED ORDER — MIDAZOLAM HCL 2 MG/2ML IJ SOLN
INTRAMUSCULAR | Status: AC
  Filled 2022-12-23: qty 2

## 2022-12-23 MED ORDER — CEFAZOLIN SODIUM-DEXTROSE 2-4 GM/100ML-% IV SOLN
2.0000 g | Freq: Four times a day (QID) | INTRAVENOUS | Status: AC
  Administered 2022-12-23 – 2022-12-24 (×2): 2 g via INTRAVENOUS
  Filled 2022-12-23 (×2): qty 100

## 2022-12-23 MED ORDER — EMPAGLIFLOZIN 25 MG PO TABS
25.0000 mg | ORAL_TABLET | Freq: Every day | ORAL | Status: DC
  Administered 2022-12-23 – 2022-12-24 (×2): 25 mg via ORAL
  Filled 2022-12-23 (×2): qty 1

## 2022-12-23 MED ORDER — 0.9 % SODIUM CHLORIDE (POUR BTL) OPTIME
TOPICAL | Status: DC | PRN
  Administered 2022-12-23: 1000 mL

## 2022-12-23 SURGICAL SUPPLY — 44 items
ACETAB CUP W/GRIPTION 54 (Plate) ×1 IMPLANT
APL SKNCLS STERI-STRIP NONHPOA (GAUZE/BANDAGES/DRESSINGS)
BAG COUNTER SPONGE SURGICOUNT (BAG) ×1 IMPLANT
BAG SPEC THK2 15X12 ZIP CLS (MISCELLANEOUS)
BAG SPNG CNTER NS LX DISP (BAG) ×1
BAG ZIPLOCK 12X15 (MISCELLANEOUS) IMPLANT
BENZOIN TINCTURE PRP APPL 2/3 (GAUZE/BANDAGES/DRESSINGS) IMPLANT
BLADE SAW SGTL 18X1.27X75 (BLADE) ×1 IMPLANT
COVER PERINEAL POST (MISCELLANEOUS) ×1 IMPLANT
COVER SURGICAL LIGHT HANDLE (MISCELLANEOUS) ×1 IMPLANT
CUP ACETAB W/GRIPTION 54 (Plate) IMPLANT
DRAPE FOOT SWITCH (DRAPES) ×1 IMPLANT
DRAPE STERI IOBAN 125X83 (DRAPES) ×1 IMPLANT
DRAPE U-SHAPE 47X51 STRL (DRAPES) ×2 IMPLANT
DRESSING AQUACEL AG SP 3.5X10 (GAUZE/BANDAGES/DRESSINGS) IMPLANT
DRSG AQUACEL AG ADV 3.5X10 (GAUZE/BANDAGES/DRESSINGS) ×1 IMPLANT
DRSG AQUACEL AG SP 3.5X10 (GAUZE/BANDAGES/DRESSINGS) ×1
DURAPREP 26ML APPLICATOR (WOUND CARE) ×1 IMPLANT
ELECT REM PT RETURN 15FT ADLT (MISCELLANEOUS) ×1 IMPLANT
GAUZE XEROFORM 1X8 LF (GAUZE/BANDAGES/DRESSINGS) ×1 IMPLANT
GLOVE BIO SURGEON STRL SZ7.5 (GLOVE) ×1 IMPLANT
GLOVE BIOGEL PI IND STRL 8 (GLOVE) ×2 IMPLANT
GLOVE ECLIPSE 8.0 STRL XLNG CF (GLOVE) ×1 IMPLANT
GOWN STRL REUS W/ TWL XL LVL3 (GOWN DISPOSABLE) ×2 IMPLANT
GOWN STRL REUS W/TWL XL LVL3 (GOWN DISPOSABLE) ×2
HANDPIECE INTERPULSE COAX TIP (DISPOSABLE) ×1
HEAD CERAMIC 36 PLUS 8.5 12 14 (Hips) IMPLANT
HOLDER FOLEY CATH W/STRAP (MISCELLANEOUS) ×1 IMPLANT
KIT TURNOVER KIT A (KITS) IMPLANT
LINER NEUTRAL 54X36MM PLUS 4 (Hips) IMPLANT
PACK ANTERIOR HIP CUSTOM (KITS) ×1 IMPLANT
SET HNDPC FAN SPRY TIP SCT (DISPOSABLE) ×1 IMPLANT
STAPLER VISISTAT 35W (STAPLE) IMPLANT
STEM FEMORAL SZ5 HIGH ACTIS (Stem) IMPLANT
STRIP CLOSURE SKIN 1/2X4 (GAUZE/BANDAGES/DRESSINGS) IMPLANT
SUT ETHIBOND NAB CT1 #1 30IN (SUTURE) ×1 IMPLANT
SUT ETHILON 2 0 PS N (SUTURE) IMPLANT
SUT MNCRL AB 4-0 PS2 18 (SUTURE) IMPLANT
SUT VIC AB 0 CT1 36 (SUTURE) ×1 IMPLANT
SUT VIC AB 1 CT1 36 (SUTURE) ×1 IMPLANT
SUT VIC AB 2-0 CT1 27 (SUTURE) ×2
SUT VIC AB 2-0 CT1 TAPERPNT 27 (SUTURE) ×2 IMPLANT
TRAY FOLEY MTR SLVR 16FR STAT (SET/KITS/TRAYS/PACK) IMPLANT
YANKAUER SUCT BULB TIP NO VENT (SUCTIONS) ×1 IMPLANT

## 2022-12-23 NOTE — Anesthesia Preprocedure Evaluation (Addendum)
Anesthesia Evaluation  Patient identified by MRN, date of birth, ID band Patient awake    Reviewed: Allergy & Precautions, NPO status , Patient's Chart, lab work & pertinent test results  Airway Mallampati: II  TM Distance: >3 FB Neck ROM: Full    Dental  (+) Teeth Intact, Dental Advisory Given   Pulmonary asthma , sleep apnea and Continuous Positive Airway Pressure Ventilation , Current Smoker    + decreased breath sounds      Cardiovascular hypertension, Pt. on medications and Pt. on home beta blockers  Rhythm:Regular Rate:Normal     Neuro/Psych negative neurological ROS  negative psych ROS   GI/Hepatic negative GI ROS, Neg liver ROS,,,  Endo/Other  diabetes, Type 2, Oral Hypoglycemic Agents    Renal/GU negative Renal ROS     Musculoskeletal  (+) Arthritis ,    Abdominal   Peds  Hematology negative hematology ROS (+)   Anesthesia Other Findings   Reproductive/Obstetrics                             Anesthesia Physical Anesthesia Plan  ASA: 3  Anesthesia Plan: Spinal   Post-op Pain Management: Tylenol PO (pre-op)*   Induction: Intravenous  PONV Risk Score and Plan: 1 and Ondansetron and Propofol infusion  Airway Management Planned: Natural Airway and Simple Face Mask  Additional Equipment: None  Intra-op Plan:   Post-operative Plan:   Informed Consent: I have reviewed the patients History and Physical, chart, labs and discussed the procedure including the risks, benefits and alternatives for the proposed anesthesia with the patient or authorized representative who has indicated his/her understanding and acceptance.     Dental advisory given  Plan Discussed with: CRNA  Anesthesia Plan Comments: (Lab Results      Component                Value               Date                      WBC                      8.9                 12/16/2022                HGB                       17.5 (H)            12/16/2022                HCT                      51.7                12/16/2022                MCV                      97.7                12/16/2022                PLT  172                 12/16/2022           )       Anesthesia Quick Evaluation

## 2022-12-23 NOTE — Transfer of Care (Signed)
Immediate Anesthesia Transfer of Care Note  Patient: [name] [name]  Procedure(s) Performed: LEFT TOTAL HIP ARTHROPLASTY ANTERIOR APPROACH (Left: Hip)  Patient Location: PACU  Anesthesia Type:Spinal  Level of Consciousness: sedated  Airway & Oxygen Therapy: Patient Spontanous Breathing and Patient connected to face mask oxygen  Post-op Assessment: Report given to RN and Post -op Vital signs reviewed and stable  Post vital signs: Reviewed and stable  Last Vitals:  Vitals Value Taken Time  BP 90/52 12/23/22 1350  Temp    Pulse 73 12/23/22 1353  Resp 19 12/23/22 1353  SpO2 95 % 12/23/22 1353  Vitals shown include unvalidated device data.  Last Pain:  Vitals:   12/23/22 0859  TempSrc: Oral         Complications: No notable events documented.

## 2022-12-23 NOTE — Anesthesia Postprocedure Evaluation (Signed)
Anesthesia Post Note  Patient: [name] [name]  Procedure(s) Performed: LEFT TOTAL HIP ARTHROPLASTY ANTERIOR APPROACH (Left: Hip)     Patient location during evaluation: PACU Anesthesia Type: Spinal Level of consciousness: oriented and awake and alert Pain management: pain level controlled Vital Signs Assessment: post-procedure vital signs reviewed and stable Respiratory status: spontaneous breathing, respiratory function stable and patient connected to nasal cannula oxygen Cardiovascular status: blood pressure returned to baseline and stable Postop Assessment: no headache, no backache and no apparent nausea or vomiting Anesthetic complications: no  No notable events documented.  Last Vitals:  Vitals:   12/23/22 1445 12/23/22 1500  BP: 116/82 129/83  Pulse: 62 63  Resp: 13 16  Temp:    SpO2: 97% 93%    Last Pain:  Vitals:   12/23/22 1445  TempSrc:   PainSc: 0-No pain                 Shelton Silvas

## 2022-12-23 NOTE — Op Note (Signed)
Operative Note  Date of operation: 12/23/2022 Preoperative diagnosis: Left hip osteoarthritis with osteonecrosis Postoperative diagnosis: Same  Procedure: Left direct anterior total hip arthroplasty  Implants: Implant Name Type Inv. Item Serial No. Manufacturer Lot No. LRB No. Used Action  ACETAB CUP W/GRIPTION 54 - [id number] Plate ACETAB CUP W/GRIPTION 54  [employer] [id number] Left 1 Implanted  LINER NEUTRAL 54X36MM PLUS 4 - [id number] Hips LINER NEUTRAL 54X36MM PLUS 4  [employer]PU[employer] [id number] Left 1 Implanted  STEM FEMORAL SZ5 HIGH ACTIS - [id number] Stem STEM FEMORAL SZ5 HIGH ACTIS  [employer] [id number] Left 1 Implanted  HEAD CERAMIC 36 PLUS 8.5 12 14  - [id number] Hips HEAD CERAMIC 36 PLUS 8.5 12 14   [employer] [id number] Left 1 Implanted   Surgeon: Vanita Panda. Magnus Ivan, MD Assistant: Rexene Edison, PA-C  Anesthesia: Spinal Antibiotics: 3 g IV Ancef EBL: 350 cc Complications: None  Indications: The patient is a 58 year old gentleman with rapidly worsening left hip pain.  He has x-rays and MRI findings showing significant osteoarthritis with osteonecrosis.  There is femoral head collapse as well.  We actually replaced his right hip successfully in 2018.  He is a large individual with a BMI of over 44.  He understands this surgery will be quite complicated given his morbid obesity.  There is a high risk of nerve vessel injury, fracture, dislocation, implant failure, leg length differences, DVT and wound healing issues.  He understands her goals are hopefully decrease pain, improve mobility, and improve quality of life.  Procedure description: After informed consent was obtained and the appropriate left hip was marked, the patient was brought to the operating room and set up on the stretcher where spinal anesthesia was obtained.  He was then laid in supine position on stretcher and a Foley catheter was placed.  Traction boots were placed on both his feet and that he was  placed supine on the Hana fracture table with a perineal post in place in both legs and inline skeletal traction devices but no traction applied.  His left operative hip was assessed radiographically.  It was then prepped and draped in DuraPrep and sterile drapes.  A timeout was called and he was identified as the correct patient the correct left hip.  An incision was then made just inferior and posterior to the ASIS and carried slightly obliquely down the leg.  Dissection was carried down to the tensor fascia lata muscle and the tensor fascia was then opened to proceed with a direct interposed the hip.  Circumflex vessels were identified and cauterized.  The hip capsule was identified and opened up in L-type format finding a moderate joint effusion.  Cobra retractors were placed around the medial and lateral femoral neck and a femoral neck cut was made with oscillating saw just proximal to the lesser trochanter and completed with an osteotome.  A corkscrew guide was placed in the femoral head and the femoral head was removed finding a wide area of arthritis but also obvious osteonecrosis with some femoral head collapse.  Remnants of the acetabular labrum and other debris removed.  A bent Hohmann was placed over the medial acetabular rim and then remnants of the acetabular labrum and other debris removed.  We then began reaming under direct visualization from a size 43 reamer and stepwise increments going up to a size 53 reamer with all reamers placed under direct visualization and the last reamer was also placed under direct fluoroscopy in order to gain the depth of reaming, the inclination  and the anteversion.  The real DePuy sector GRIPTION acetabular pendant size 54 was then placed without difficulty followed by a 36+4 polythene liner.  Attention was then turned to the femur.  With the right leg externally rotated to 120 degrees, extended and adducted, a Mueller retractor was placed medially and a Hohmann  retractor was placed behind the greater trochanter.  The lateral joint capsule was released and a box cutting osteotome was used to enter the femoral canal.  Broaching was then utilized with the Actis broaching system from a size 0 going up to a size 5.  With a size 5 in place we trialed a standard offset femoral neck and a 36+1.5 trial hip ball.  This was reduced in the acetabulum and it was very unstable.  We then decided from there to go with the real femoral component size 5 the 1 with high offset.  We placed that with the difficulty and we went up to a 36+8.5 ceramic hip ball.  This was reduced and acetabulum we are pleased with leg length, range of motion, offset and stability assessed mechanically and radiographically.  We then irrigated the soft tissue normal saline solution.  Some remnants of the joint capsule were closed with interrupted #1 Ethibond suture.  #1 Vicryl was used to close the tensor fascia and 0 Vicryl used to close deep tissue with 2-0 Vicryl closing the subcutaneous tissue.  Skin was closed with staples.  Aquacel dressing was applied.  The patient was taken recovery in stable condition with all final counts correct and no complications noted.  The case was incredibly difficult given the patient's size and morbid obesity.  Rexene Edison, PA-C did assist during the entire case from beginning to end and his assistance was critical and medically necessary for soft tissue management and retraction, helping guide implant placement and a layered closure of the wound.

## 2022-12-23 NOTE — Anesthesia Procedure Notes (Signed)
Spinal  Start time: 12/23/2022 11:55 AM End time: 12/23/2022 11:58 AM Reason for block: surgical anesthesia Staffing Performed: anesthesiologist  Anesthesiologist: Shelton Silvas, MD Performed by: Shelton Silvas, MD Authorized by: Shelton Silvas, MD   Preanesthetic Checklist Completed: patient identified, IV checked, site marked, risks and benefits discussed, surgical consent, monitors and equipment checked, pre-op evaluation and timeout performed Spinal Block Patient position: sitting Prep: DuraPrep and site prepped and draped Location: L3-4 Injection technique: single-shot Needle Needle type: Pencan  Needle gauge: 24 G Needle length: 10 cm Needle insertion depth: 10 cm Additional Notes Patient tolerated well. No immediate complications.  Functioning IV was confirmed and monitors were applied. Sterile prep and drape, including hand hygiene and sterile gloves were used. The patient was positioned and the back was prepped. The skin was anesthetized with lidocaine. Free flow of clear CSF was obtained prior to injecting local anesthetic into the CSF. The spinal needle aspirated freely following injection. The needle was carefully withdrawn. The patient tolerated the procedure well.

## 2022-12-23 NOTE — Evaluation (Signed)
Physical Therapy Evaluation Patient Details Name: [name] [name] MRN: [id number] DOB: [birth year] Today's Date: 12/23/2022  History of Present Illness  57 yo male presents to therapy s/p L THA, anterior approach on 12/23/2022 due to failure of conservative measures. Pt PMH includes but is not limited to: DM II, HDL, HTN, asthma, OSA on CPAP, tobacco abuse and R THA (2019).  Clinical Impression    [name] [name] is a 57 y.o. male POD 0 s/p L THA, AA. Patient reports mod I with RW for mobility at baseline. Patient is now limited by functional impairments (see PT problem list below) and requires S for bed mobility and min guard and cues for transfers. Patient was able to ambulate 100 feet with RW and min guard level of assist. Patient instructed in exercise to facilitate ROM and circulation to manage edema. Patient will benefit from continued skilled PT interventions to address impairments and progress towards PLOF. Acute PT will follow to progress mobility and stair training in preparation for safe discharge home with family support and HHPT.      Recommendations for follow up therapy are one component of a multi-disciplinary discharge planning process, led by the attending physician.  Recommendations may be updated based on patient status, additional functional criteria and insurance authorization.  Follow Up Recommendations       Assistance Recommended at Discharge Intermittent Supervision/Assistance  Patient can return home with the following  A little help with walking and/or transfers;A little help with bathing/dressing/bathroom;Assistance with cooking/housework;Assist for transportation;Help with stairs or ramp for entrance    Equipment Recommendations None recommended by PT (pt reports DME in home setting)  Recommendations for Other Services       Functional Status Assessment Patient has had a recent decline in their functional status and demonstrates the ability to make significant  improvements in function in a reasonable and predictable amount of time.     Precautions / Restrictions Precautions Precautions: Fall Restrictions Weight Bearing Restrictions: No      Mobility  Bed Mobility Overal bed mobility: Needs Assistance Bed Mobility: Supine to Sit     Supine to sit: Supervision, HOB elevated     General bed mobility comments: min cues    Transfers Overall transfer level: Needs assistance Equipment used: Rolling walker (2 wheels) Transfers: Sit to/from Stand Sit to Stand: Min guard           General transfer comment: cues for proper UE and AD placement    Ambulation/Gait Ambulation/Gait assistance: Min guard Gait Distance (Feet): 100 Feet Assistive device: Rolling walker (2 wheels) Gait Pattern/deviations: Step-to pattern, Wide base of support, Antalgic Gait velocity: decreased     General Gait Details: step almost through pattern  Stairs            Wheelchair Mobility    Modified Rankin (Stroke Patients Only)       Balance Overall balance assessment: Needs assistance Sitting-balance support: Feet supported Sitting balance-Leahy Scale: Good     Standing balance support: Bilateral upper extremity supported, During functional activity, Reliant on assistive device for balance (static standing no UE support) Standing balance-Leahy Scale: Fair                               Pertinent Vitals/Pain Pain Assessment Pain Assessment: 0-10 Pain Score: 2  Pain Location: L hip Pain Descriptors / Indicators: Aching, Discomfort, Constant Pain Intervention(s): Limited activity within patient's tolerance, Monitored during session, Premedicated before session, Repositioned,  Ice applied    Home Living Family/patient expects to be discharged to:: Private residence Living Arrangements: Spouse/significant other;Children Available Help at Discharge: Family Type of Home: House Home Access: Stairs to enter Entrance  Stairs-Rails: Right Entrance Stairs-Number of Steps: 4   Home Layout: One level Home Equipment: Agricultural consultant (2 wheels);Cane - single point;Shower seat - built in;Grab bars - tub/shower      Prior Function Prior Level of Function : Independent/Modified Independent             Mobility Comments: Mod I at RW level for all ADLs, self care tasks, IADLs with the exception of donning socks ADLs Comments: 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     Hand Dominance        Extremity/Trunk Assessment        Lower Extremity Assessment Lower Extremity Assessment: LLE deficits/detail LLE Deficits / Details: ankle DF/PF 5/5 LLE Sensation: WNL    Cervical / Trunk Assessment Cervical / Trunk Assessment: Normal  Communication   Communication: No difficulties  Cognition Arousal/Alertness: Awake/alert Behavior During Therapy: WFL for tasks assessed/performed Overall Cognitive Status: Within Functional Limits for tasks assessed                                          General Comments      Exercises Total Joint Exercises Ankle Circles/Pumps: AROM, Both, 20 reps Quad Sets: AROM, Left, 5 reps Hip ABduction/ADduction: AROM, Left, 5 reps   Assessment/Plan    PT Assessment Patient needs continued PT services  PT Problem List Decreased strength;Decreased activity tolerance;Decreased balance;Decreased mobility;Decreased coordination;Pain       PT Treatment Interventions DME instruction;Gait training;Stair training;Functional mobility training;Therapeutic activities;Therapeutic exercise;Balance training;Neuromuscular re-education;Patient/family education;Modalities    PT Goals (Current goals can be found in the Care Plan section)  Acute Rehab PT Goals 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 Formulation: With patient Time For Goal Achievement: 01/06/23 Potential to Achieve Goals: Good    Frequency  7X/week     Co-evaluation               AM-PAC PT "6 Clicks" Mobility  Outcome Measure Help needed turning from your back to your side while in a flat bed without using bedrails?: None Help needed moving from lying on your back to sitting on the side of a flat bed without using bedrails?: A Little Help needed moving to and from a bed to a chair (including a wheelchair)?: A Little Help needed standing up from a chair using your arms (e.g., wheelchair or bedside chair)?: A Little Help needed to walk in hospital room?: A Little Help needed climbing 3-5 steps with a railing? : A Lot 6 Click Score: 18    End of Session Equipment Utilized During Treatment: Gait belt Activity Tolerance: Patient tolerated treatment well Patient left: in chair;with call bell/phone within reach Nurse Communication: Mobility status PT Visit Diagnosis: Unsteadiness on feet (R26.81);Other abnormalities of gait and mobility (R26.89);Muscle weakness (generalized) (M62.81);Pain Pain - Right/Left: Left Pain - part of body: Hip    Time: 1610-9604 PT Time Calculation (min) (ACUTE ONLY): 26 min   Charges:   PT Evaluation $PT Eval Low Complexity: 1 Low PT Treatments $Gait Training: 8-22 mins        Johnny Bridge, PT Acute Rehab  Jacqualyn Posey 12/23/2022, 7:11 PM

## 2022-12-23 NOTE — Interval H&P Note (Signed)
History and Physical Interval Note: The patient understands that he is here today for a left hip replacement surgery to severe left hip arthritis.  There has been no acute or interval change in his medical status.  See H&P.  The risks and benefits of surgery have been explained in detail and informed consent is obtained.  The left operative hip has been marked.  12/23/2022 10:41 AM  [name] [name]  has presented today for surgery, with the diagnosis of osteoarthritis left hip.  The various methods of treatment have been discussed with the patient and family. After consideration of risks, benefits and other options for treatment, the patient has consented to  Procedure(s): LEFT TOTAL HIP ARTHROPLASTY ANTERIOR APPROACH (Left) as a surgical intervention.  The patient's history has been reviewed, patient examined, no change in status, stable for surgery.  I have reviewed the patient's chart and labs.  Questions were answered to the patient's satisfaction.     Kathryne Hitch

## 2022-12-24 ENCOUNTER — Encounter (HOSPITAL_COMMUNITY): Payer: Self-pay | Admitting: Orthopaedic Surgery

## 2022-12-24 DIAGNOSIS — M1612 Unilateral primary osteoarthritis, left hip: Secondary | ICD-10-CM | POA: Diagnosis not present

## 2022-12-24 LAB — CBC
HCT: 43.9 % (ref 39.0–52.0)
Hemoglobin: 14.6 g/dL (ref 13.0–17.0)
MCH: 33.7 pg (ref 26.0–34.0)
MCHC: 33.3 g/dL (ref 30.0–36.0)
MCV: 101.4 fL — ABNORMAL HIGH (ref 80.0–100.0)
Platelets: 138 10*3/uL — ABNORMAL LOW (ref 150–400)
RBC: 4.33 MIL/uL (ref 4.22–5.81)
RDW: 14.3 % (ref 11.5–15.5)
WBC: 11.6 10*3/uL — ABNORMAL HIGH (ref 4.0–10.5)
nRBC: 0 % (ref 0.0–0.2)

## 2022-12-24 LAB — BASIC METABOLIC PANEL
Anion gap: 9 (ref 5–15)
BUN: 15 mg/dL (ref 6–20)
CO2: 29 mmol/L (ref 22–32)
Calcium: 8.8 mg/dL — ABNORMAL LOW (ref 8.9–10.3)
Chloride: 96 mmol/L — ABNORMAL LOW (ref 98–111)
Creatinine, Ser: 0.86 mg/dL (ref 0.61–1.24)
GFR, Estimated: >60 mL/min (ref >60)
Glucose, Bld: 137 mg/dL — ABNORMAL HIGH (ref 70–99)
Potassium: 4.1 mmol/L (ref 3.5–5.1)
Sodium: 134 mmol/L — ABNORMAL LOW (ref 135–145)

## 2022-12-24 MED ORDER — TIZANIDINE HCL 4 MG PO TABS: 4.0000 mg | ORAL_TABLET | Freq: Four times a day (QID) | ORAL | 0 refills | Status: DC | PRN

## 2022-12-24 MED ORDER — OXYCODONE HCL 5 MG PO TABS: 5.0000 mg | ORAL_TABLET | ORAL | 0 refills | Status: DC | PRN

## 2022-12-24 NOTE — Progress Notes (Signed)
Physical Therapy Treatment Patient Details Name: [name] [name] MRN: [id number] DOB: June 05, 1966 Today's Date: 12/24/2022   History of Present Illness 57 yo male presents to therapy s/p L THA, anterior approach on 12/23/2022 due to failure of conservative measures. Pt PMH includes but is not limited to: DM II, HDL, HTN, asthma, OSA on CPAP, tobacco abuse and R THA (2019).    PT Comments    Pt seen POD1 received supine in bed reporting poor sleep and some L-hip pain but otherwise no complaint. Required SUP for bed mobility with education provided on use of gait belt to self-assist, HOB elevated. Pt required SUP for transfers, and min-guard for ambulation in hallway with RW 156ft. Pt completed stair training with min guard and cuing for sequencing. Provided HEP and pt completed exercises with safe form and minimal cuing, emphasized hourly walking program with RW at home. All education completed and pt has no further questions. Pt has met mobility goals for safe discharge home, PT is signing off, should needs change please reconsult. Thank you for this referral.    Recommendations for follow up therapy are one component of a multi-disciplinary discharge planning process, led by the attending physician.  Recommendations may be updated based on patient status, additional functional criteria and insurance authorization.  Follow Up Recommendations       Assistance Recommended at Discharge Intermittent Supervision/Assistance  Patient can return home with the following A little help with walking and/or transfers;A little help with bathing/dressing/bathroom;Assistance with cooking/housework;Assist for transportation;Help with stairs or ramp for entrance   Equipment Recommendations  None recommended by PT (pt reports DME in home setting)    Recommendations for Other Services       Precautions / Restrictions Precautions Precautions: Fall Restrictions Weight Bearing Restrictions: No     Mobility   Bed Mobility Overal bed mobility: Needs Assistance Bed Mobility: Supine to Sit     Supine to sit: Supervision, HOB elevated     General bed mobility comments: Use of gait belt to self-assist LLE    Transfers Overall transfer level: Needs assistance Equipment used: Rolling walker (2 wheels) Transfers: Sit to/from Stand Sit to Stand: Min guard           General transfer comment: cues for proper UE and AD placement    Ambulation/Gait Ambulation/Gait assistance: Min guard Gait Distance (Feet): 130 Feet Assistive device: Rolling walker (2 wheels) Gait Pattern/deviations: Step-to pattern, Wide base of support, Antalgic Gait velocity: decreased     General Gait Details: Pt ambulated with step through pattern with RW and SUP, no physical assist or overt LOB noted.   Stairs Stairs: Yes Stairs assistance: Min guard Stair Management: One rail Right, Step to pattern, Forwards Number of Stairs: 4 General stair comments: Pt educated on stair mobility with RUE railing, no physical assist required or overt LOB noted, verbal cues for use of R railing only to mimic home environment.   Wheelchair Mobility    Modified Rankin (Stroke Patients Only)       Balance Overall balance assessment: Needs assistance Sitting-balance support: Feet supported Sitting balance-Leahy Scale: Good     Standing balance support: Bilateral upper extremity supported, During functional activity, Reliant on assistive device for balance (static standing no UE support) Standing balance-Leahy Scale: Fair                              Cognition Arousal/Alertness: Awake/alert Behavior During Therapy: WFL for tasks assessed/performed Overall  Cognitive Status: Within Functional Limits for tasks assessed                                          Exercises Total Joint Exercises Ankle Circles/Pumps: AROM, Both, 20 reps Quad Sets: AROM, Left, 10 reps Short Arc Quad: AROM,  Left, 10 reps Heel Slides: Left, 10 reps, AAROM (with belt) Hip ABduction/ADduction: AROM, Left, 5 reps    General Comments        Pertinent Vitals/Pain Pain Assessment Pain Assessment: 0-10 Pain Score: 3  Pain Location: L hip Pain Descriptors / Indicators: Aching, Discomfort, Constant Pain Intervention(s): Monitored during session, Repositioned, Ice applied    Home Living Family/patient expects to be discharged to:: Private residence Living Arrangements: Spouse/significant other;Children Available Help at Discharge: Family Type of Home: House Home Access: Stairs to enter Entrance Stairs-Rails: Right Entrance Stairs-Number of Steps: 4   Home Layout: One level Home Equipment: Agricultural consultant (2 wheels);Cane - single point;Shower seat - built in;Grab bars - tub/shower      Prior Function            PT Goals (current goals can now be found in the care plan section) Acute Rehab PT Goals 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 Formulation: With patient Time For Goal Achievement: 01/06/23 Potential to Achieve Goals: Good Progress towards PT goals: Goals met/education completed, patient discharged from PT    Frequency    7X/week      PT Plan Current plan remains appropriate    Co-evaluation              AM-PAC PT "6 Clicks" Mobility   Outcome Measure  Help needed turning from your back to your side while in a flat bed without using bedrails?: None Help needed moving from lying on your back to sitting on the side of a flat bed without using bedrails?: A Little Help needed moving to and from a bed to a chair (including a wheelchair)?: A Little Help needed standing up from a chair using your arms (e.g., wheelchair or bedside chair)?: A Little Help needed to walk in hospital room?: A Little Help needed climbing 3-5 steps with a railing? : A Little 6 Click Score: 19    End of Session Equipment Utilized During Treatment: Gait  belt Activity Tolerance: Patient tolerated treatment well Patient left: in chair;with call bell/phone within reach Nurse Communication: Mobility status PT Visit Diagnosis: Unsteadiness on feet (R26.81);Other abnormalities of gait and mobility (R26.89);Muscle weakness (generalized) (M62.81);Pain Pain - Right/Left: Left Pain - part of body: Hip     Time: 1610-9604 PT Time Calculation (min) (ACUTE ONLY): 33 min  Charges:  $Gait Training: 8-22 mins $Therapeutic Exercise: 8-22 mins                     Jamesetta Geralds, PT, DPT [facility] Department Office: [phone] Weekend pager: [phone]    Jamesetta Geralds 12/24/2022, 11:19 AM

## 2022-12-24 NOTE — Progress Notes (Signed)
Subjective: 1 Day Post-Op Procedure(s) (LRB): LEFT TOTAL HIP ARTHROPLASTY ANTERIOR APPROACH (Left) Patient reports pain as moderate.    Objective: Vital signs in last 24 hours: Temp:  [97.1 F (36.2 C)-99 F (37.2 C)] 99 F (37.2 C) (05/11 0851) Pulse Rate:  [57-85] 84 (05/11 0851) Resp:  [10-20] 18 (05/11 0851) BP: (90-133)/(52-87) 130/85 (05/11 0851) SpO2:  [90 %-100 %] 95 % (05/11 0851) FiO2 (%):  [21 %] 21 % (05/10 2109)  Intake/Output from previous day: 05/10 0701 - 05/11 0700 In: 2900.2 [P.O.:840; I.V.:1760.2; IV Piggyback:300] Out: 3600 [Urine:3250; Blood:350] Intake/Output this shift: No intake/output data recorded.  Recent Labs    12/24/22 0325  HGB 14.6   Recent Labs    12/24/22 0325  WBC 11.6*  RBC 4.33  HCT 43.9  PLT 138*   Recent Labs    12/24/22 0325  NA 134*  K 4.1  CL 96*  CO2 29  BUN 15  CREATININE 0.86  GLUCOSE 137*  CALCIUM 8.8*   No results for input(s): "LABPT", "INR" in the last 72 hours.  Sensation intact distally Intact pulses distally Dorsiflexion/Plantar flexion intact Incision: dressing C/D/I   Assessment/Plan: 1 Day Post-Op Procedure(s) (LRB): LEFT TOTAL HIP ARTHROPLASTY ANTERIOR APPROACH (Left) Up with therapy Discharge home with home health      Kathryne Hitch 12/24/2022, 11:13 AM

## 2022-12-24 NOTE — Discharge Summary (Signed)
Patient ID: Ather Rocker MRN: [id number] DOB/AGE: [birth year] 57 y.o.  Admit date: 12/23/2022 Discharge date: 12/24/2022  Admission Diagnoses:  Principal Problem:   Unilateral primary osteoarthritis, left hip Active Problems:   Status post total replacement of left hip   Discharge Diagnoses:  Same  Past Medical History:  Diagnosis Date   Allergy    Arthritis    Asthma    Diabetes mellitus without complication (HCC)    type 2   Hyperlipidemia    Hypertension    Sleep apnea    cpap    Surgeries: Procedure(s): LEFT TOTAL HIP ARTHROPLASTY ANTERIOR APPROACH on 12/23/2022   Consultants:   Discharged Condition: Improved  Hospital Course: [name] [name] is an 57 y.o. male who was admitted 12/23/2022 for operative treatment ofUnilateral primary osteoarthritis, left hip. Patient has severe unremitting pain that affects sleep, daily activities, and work/hobbies. After pre-op clearance the patient was taken to the operating room on 12/23/2022 and underwent  Procedure(s): LEFT TOTAL HIP ARTHROPLASTY ANTERIOR APPROACH.    Patient was given perioperative antibiotics:  Anti-infectives (From admission, onward)    Start     Dose/Rate Route Frequency Ordered Stop   12/23/22 1800  ceFAZolin (ANCEF) IVPB 2g/100 mL premix        2 g 200 mL/hr over 30 Minutes Intravenous Every 6 hours 12/23/22 1519 12/24/22 0109   12/23/22 0900  ceFAZolin (ANCEF) IVPB 3g/100 mL premix        3 g 200 mL/hr over 30 Minutes Intravenous On call to O.R. 12/23/22 0853 12/23/22 1200        Patient was given sequential compression devices, early ambulation, and chemoprophylaxis to prevent DVT.  Patient benefited maximally from hospital stay and there were no complications.    Recent vital signs: Patient Vitals for the past 24 hrs:  BP Temp Temp src Pulse Resp SpO2  12/24/22 0851 130/85 99 F (37.2 C) Oral 84 18 95 %  12/24/22 0501 128/83 98.1 F (36.7 C) -- 84 17 92 %  12/24/22 0129 114/72 98.8 F (37.1  C) Oral 85 17 90 %  12/23/22 2135 133/78 98.5 F (36.9 C) Oral 81 16 94 %  12/23/22 1723 121/71 98.1 F (36.7 C) Oral -- 16 97 %  12/23/22 1515 119/77 -- -- (!) 57 16 --  12/23/22 1500 129/83 -- -- 63 16 93 %  12/23/22 1445 116/82 -- -- 62 13 97 %  12/23/22 1430 123/87 (!) 97.5 F (36.4 C) -- 61 10 100 %  12/23/22 1415 115/79 -- -- 71 12 100 %  12/23/22 1401 -- -- -- 73 16 100 %  12/23/22 1400 103/72 -- -- 75 19 96 %  12/23/22 1350 (!) 90/52 (!) 97.1 F (36.2 C) -- -- 20 98 %     Recent laboratory studies:  Recent Labs    12/24/22 0325  WBC 11.6*  HGB 14.6  HCT 43.9  PLT 138*  NA 134*  K 4.1  CL 96*  CO2 29  BUN 15  CREATININE 0.86  GLUCOSE 137*  CALCIUM 8.8*     Discharge Medications:   Allergies as of 12/24/2022       Reactions   Ace Inhibitors Swelling, Other (See Comments)   Angioedema    Simvastatin Other (See Comments)   myalgia   Codeine Itching        Medication List     TAKE these medications    albuterol 108 (90 Base) MCG/ACT inhaler Commonly known as: VENTOLIN HFA Inhale 1-2  puffs into the lungs every 6 (six) hours as needed for wheezing or shortness of breath.   aspirin 81 MG chewable tablet Chew 1 tablet (81 mg total) by mouth 2 (two) times daily. What changed:  how much to take when to take this   carvedilol 12.5 MG tablet Commonly known as: COREG Take 12.5 mg by mouth 2 (two) times daily.   celecoxib 200 MG capsule Commonly known as: CELEBREX Take 1 capsule (200 mg total) by mouth 2 (two) times daily between meals as needed. What changed: when to take this   citalopram 40 MG tablet Commonly known as: CELEXA Take 40 mg by mouth every evening.   escitalopram 10 MG tablet Commonly known as: LEXAPRO Take 10 mg by mouth daily.   ezetimibe 10 MG tablet Commonly known as: ZETIA Take 10 mg by mouth daily.   hydrochlorothiazide 25 MG tablet Commonly known as: HYDRODIURIL Take 25 mg by mouth daily.   Jardiance 25 MG Tabs  tablet Generic drug: empagliflozin Take 25 mg by mouth daily.   MELATONIN PO Take 40 mg by mouth at bedtime.   metFORMIN 500 MG 24 hr tablet Commonly known as: GLUCOPHAGE-XR Take 1,000 mg by mouth 2 (two) times daily.   montelukast 10 MG tablet Commonly known as: SINGULAIR Take 10 mg by mouth daily.   oxyCODONE 5 MG immediate release tablet Commonly known as: Oxy IR/ROXICODONE Take 1-2 tablets (5-10 mg total) by mouth every 4 (four) hours as needed for moderate pain (pain score 4-6).   pioglitazone 15 MG tablet Commonly known as: ACTOS Take 15 mg by mouth daily.   tiZANidine 4 MG tablet Commonly known as: Zanaflex Take 1 tablet (4 mg total) by mouth every 6 (six) hours as needed for muscle spasms.   zolpidem 10 MG tablet Commonly known as: AMBIEN Take 10 mg by mouth at bedtime as needed for sleep.               Durable Medical Equipment  (From admission, onward)           Start     Ordered   12/23/22 1520  DME 3 n 1  Once        12/23/22 1519   12/23/22 1520  DME Walker rolling  Once       Question Answer Comment  Walker: With 5 Inch Wheels   Patient needs a walker to treat with the following condition Status post total replacement of left hip      12/23/22 1519            Diagnostic Studies: DG Pelvis Portable  Result Date: 12/23/2022 CLINICAL DATA:  Postop. EXAM: PORTABLE PELVIS 1-2 VIEWS COMPARISON:  None Available. FINDINGS: Left hip arthroplasty in expected alignment. No periprosthetic lucency or fracture. Recent postsurgical change includes air and edema in the soft tissues. Lateral skin staples in place. There is been prior right hip arthroplasty. IMPRESSION: Left hip arthroplasty without complication. Electronically Signed   By: Narda Rutherford M.D.   On: 12/23/2022 15:55   DG HIP UNILAT WITH PELVIS 1V LEFT  Result Date: 12/23/2022 CLINICAL DATA:  [id number] Surgery, elective [id number] EXAM: DG HIP (WITH OR WITHOUT PELVIS) 1V*L* COMPARISON:  MRI  11/13/2022 FINDINGS: Intraoperative images during left hip arthroplasty. Normal alignment. No evidence of immediate hardware complication. Expected soft tissue changes. IMPRESSION: Intraoperative images during left hip arthroplasty. Normal alignment. No evidence of immediate hardware complication. Electronically Signed   By: Caprice Renshaw M.D.   On: 12/23/2022  14:01   DG C-Arm 1-60 Min-No Report  Result Date: 12/23/2022 Fluoroscopy was utilized by the requesting physician.  No radiographic interpretation.   DG C-Arm 1-60 Min-No Report  Result Date: 12/23/2022 Fluoroscopy was utilized by the requesting physician.  No radiographic interpretation.    Disposition: Discharge disposition: 01-Home or Self Care          Follow-up Information     Kathryne Hitch, MD Follow up in 2 week(s).   Specialty: Orthopedic Surgery Contact information: [address] [place] [address] [phone]                  Signed: Kathryne Hitch 12/24/2022, 11:14 AM

## 2022-12-24 NOTE — TOC Transition Note (Signed)
Transition of Care [facility]) - CM/SW Discharge Note   Patient Details  Name: [name] [name] MRN: [id number] Date of Birth: April 05, 1966  [facility] CM/SW Contact:  Amada Jupiter, LCSW Phone Number: 12/24/2022, 11:49 AM   Clinical Narrative:     Met with pt and confirming he has needed DME at home.  HHPT prearranged with [facility].  No further TOC needs.  Final next level of care: Home w Home Health Services Barriers to Discharge: No Barriers Identified   Patient Goals and CMS Choice      Discharge Placement                         Discharge Plan and Services Additional resources added to the After Visit Summary for                  DME Arranged: N/A DME Agency: NA       HH Arranged: PT HH Agency: [facility]        Social Determinants of Health (SDOH) Interventions SDOH Screenings   Food Insecurity: No Food Insecurity (12/23/2022)  Housing: Low Risk  (12/23/2022)  Transportation Needs: No Transportation Needs (12/23/2022)  Utilities: Not At Risk (12/23/2022)  Tobacco Use: High Risk (12/24/2022)     Readmission Risk Interventions     No data to display

## 2022-12-24 NOTE — Discharge Instructions (Signed)

## 2022-12-24 NOTE — Plan of Care (Signed)
?  Problem: Education: ?Goal: Knowledge of the prescribed therapeutic regimen will improve ?Outcome: Progressing ?  ?Problem: Pain Management: ?Goal: Pain level will decrease with appropriate interventions ?Outcome: Progressing ?  ?Problem: Pain Managment: ?Goal: General experience of comfort will improve ?Outcome: Progressing ?  ?

## 2022-12-27 ENCOUNTER — Telehealth: Payer: Self-pay | Admitting: Orthopaedic Surgery

## 2022-12-27 NOTE — Telephone Encounter (Signed)
Verbal order given  

## 2022-12-27 NOTE — Telephone Encounter (Signed)
Received call from Paulla Fore (PT) with [facility] needing verbal orders for HHPT 3 WK 2. The number to contact A[facility] is [phone]

## 2022-12-29 ENCOUNTER — Other Ambulatory Visit: Payer: Self-pay | Admitting: Orthopaedic Surgery

## 2022-12-29 MED ORDER — OXYCODONE HCL 5 MG PO TABS: 5.0000 mg | ORAL_TABLET | Freq: Four times a day (QID) | ORAL | 0 refills | Status: DC | PRN

## 2022-12-30 ENCOUNTER — Encounter: Payer: Self-pay | Admitting: Orthopaedic Surgery

## 2023-01-04 ENCOUNTER — Other Ambulatory Visit: Payer: Self-pay

## 2023-01-04 MED ORDER — TIZANIDINE HCL 4 MG PO TABS: 4.0000 mg | ORAL_TABLET | Freq: Four times a day (QID) | ORAL | 0 refills | Status: DC | PRN

## 2023-01-05 ENCOUNTER — Telehealth: Payer: Self-pay | Admitting: Orthopaedic Surgery

## 2023-01-05 ENCOUNTER — Encounter: Payer: Self-pay | Admitting: Orthopaedic Surgery

## 2023-01-05 ENCOUNTER — Ambulatory Visit (INDEPENDENT_AMBULATORY_CARE_PROVIDER_SITE_OTHER): Payer: 59 | Admitting: Orthopaedic Surgery

## 2023-01-05 DIAGNOSIS — Z96642 Presence of left artificial hip joint: Secondary | ICD-10-CM

## 2023-01-05 NOTE — Progress Notes (Signed)
The patient is here today for his first postoperative visit status post a left total hip replacement.  He is doing well.  We replaced his right hip remotely but his left hip was more acute in terms of the pain that he experienced in the left hip after mechanical fall and we did find damage in the hip from that fall.  He is doing well overall.  He has been compliant with a baby aspirin twice a day but was actually on this twice a day before surgery due to a heart history that runs in the family.  He did drive today.  He is using his pain medication sparingly.  On exam there is no seroma of his left hip.  The incision has the staples removed and Steri-Strips applied.  There is some breakdown just in the groin crease and he will clean this daily.  Overall though things look good.  I would like to see him back in just 2 weeks for a wound check.  He will slowly increase his activities as comfort allows.  He will go slow with exercising.

## 2023-01-05 NOTE — Telephone Encounter (Signed)
Pleas schedule a 2 week follow up with [facility]. Patient states just to book it he will get it on my chart. I dont have anything till the 19th of June

## 2023-01-10 ENCOUNTER — Other Ambulatory Visit: Payer: Self-pay | Admitting: Orthopaedic Surgery

## 2023-01-10 MED ORDER — OXYCODONE HCL 5 MG PO TABS: 5.0000 mg | ORAL_TABLET | Freq: Four times a day (QID) | ORAL | 0 refills | Status: DC | PRN

## 2023-01-17 ENCOUNTER — Encounter: Payer: Self-pay | Admitting: Orthopaedic Surgery

## 2023-01-18 ENCOUNTER — Encounter: Payer: Self-pay | Admitting: Orthopaedic Surgery

## 2023-01-19 ENCOUNTER — Other Ambulatory Visit: Payer: Self-pay | Admitting: Orthopaedic Surgery

## 2023-01-19 ENCOUNTER — Encounter: Payer: Self-pay | Admitting: Orthopaedic Surgery

## 2023-01-19 MED ORDER — DOXYCYCLINE HYCLATE 100 MG PO TABS: 100.0000 mg | ORAL_TABLET | Freq: Two times a day (BID) | ORAL | 0 refills | Status: DC

## 2023-01-19 MED ORDER — MUPIROCIN CALCIUM 2 % EX CREA: 1.0000 | TOPICAL_CREAM | Freq: Every day | CUTANEOUS | 1 refills | Status: AC

## 2023-01-19 MED ORDER — OXYCODONE HCL 5 MG PO TABS: 5.0000 mg | ORAL_TABLET | Freq: Four times a day (QID) | ORAL | 0 refills | Status: DC | PRN

## 2023-01-23 ENCOUNTER — Ambulatory Visit (INDEPENDENT_AMBULATORY_CARE_PROVIDER_SITE_OTHER): Payer: 59 | Admitting: Orthopaedic Surgery

## 2023-01-23 ENCOUNTER — Encounter: Payer: Self-pay | Admitting: Orthopaedic Surgery

## 2023-01-23 DIAGNOSIS — Z96642 Presence of left artificial hip joint: Secondary | ICD-10-CM

## 2023-01-23 NOTE — Progress Notes (Signed)
The patient is now a month out from a left total hip arthroplasty.  I wanted to see him today for a wound check.  On exam his left hip superior aspect of the wound has some dehiscence.  There is no evidence of infection.  I did show him some wound care and gave him instructions about keeping it clean and changing this on a daily basis.  I would like to reevaluate him in 4 weeks but no x-rays are needed.  Of note, the patient is well-known to me.  He does have some pre-existing arthritis in his left hip but he may not have needed hip replacement for a long period of time.  This recent accident absolutely contributed to worsening the arthritis in his left hip and there was certainly posttraumatic findings on his imaging studies they correlated with an acute accident that led to his worsening pain and necessitated the need for a hip replacement earlier.

## 2023-01-24 ENCOUNTER — Other Ambulatory Visit: Payer: Self-pay | Admitting: Orthopaedic Surgery

## 2023-01-24 MED ORDER — TIZANIDINE HCL 4 MG PO TABS: 4.0000 mg | ORAL_TABLET | Freq: Four times a day (QID) | ORAL | 0 refills | Status: DC | PRN

## 2023-01-25 ENCOUNTER — Other Ambulatory Visit: Payer: Self-pay | Admitting: Orthopaedic Surgery

## 2023-01-25 MED ORDER — OXYCODONE HCL 5 MG PO TABS: 5.0000 mg | ORAL_TABLET | Freq: Three times a day (TID) | ORAL | 0 refills | Status: DC | PRN

## 2023-02-02 ENCOUNTER — Other Ambulatory Visit: Payer: Self-pay | Admitting: Orthopaedic Surgery

## 2023-02-02 MED ORDER — OXYCODONE HCL 5 MG PO TABS: 5.0000 mg | ORAL_TABLET | Freq: Three times a day (TID) | ORAL | 0 refills | Status: DC | PRN

## 2023-02-08 ENCOUNTER — Other Ambulatory Visit: Payer: Self-pay | Admitting: Orthopaedic Surgery

## 2023-02-10 ENCOUNTER — Other Ambulatory Visit (HOSPITAL_BASED_OUTPATIENT_CLINIC_OR_DEPARTMENT_OTHER): Payer: Self-pay | Admitting: Orthopaedic Surgery

## 2023-02-10 MED ORDER — OXYCODONE HCL 5 MG PO TABS: 5.0000 mg | ORAL_TABLET | Freq: Three times a day (TID) | ORAL | 0 refills | Status: DC | PRN

## 2023-02-20 ENCOUNTER — Encounter: Payer: Self-pay | Admitting: Physician Assistant

## 2023-02-20 ENCOUNTER — Ambulatory Visit (INDEPENDENT_AMBULATORY_CARE_PROVIDER_SITE_OTHER): Payer: 59 | Admitting: Physician Assistant

## 2023-02-20 DIAGNOSIS — Z96642 Presence of left artificial hip joint: Secondary | ICD-10-CM

## 2023-02-20 MED ORDER — TIZANIDINE HCL 4 MG PO TABS: 4.0000 mg | ORAL_TABLET | Freq: Four times a day (QID) | ORAL | 1 refills | Status: AC | PRN

## 2023-02-20 MED ORDER — OXYCODONE HCL 5 MG PO TABS: 5.0000 mg | ORAL_TABLET | Freq: Three times a day (TID) | ORAL | 0 refills | Status: DC | PRN

## 2023-02-20 NOTE — Progress Notes (Addendum)
HPI: [name] [name] returns today for follow-up status post left total hip arthroplasty 12/23/2022.  He states still having some pain.  Pain is 5 out of 10 described as a dull pain within bone.  Points to the femur midshaft.  He is riding exercise bike 10 minutes a day.  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  He is taking oxycodone and a muscle relaxant mainly at night.  He is ambulating with a cane.  He has had no new injury.  Review of systems: Denies any fevers or chills.  See HPI otherwise negative  Physical exam: General well-developed well-nourished male no acute distress. Left hip good range of motion.  Surgical incision is healing well.  Area of wound dehiscence is healing in well.  Good granulation.  No malodor.  No drainage.  No gross signs of infection.  Left calf supple nontender.  Impression: Status post left total hip arthroplasty 12/23/2022  Plan: He will continue current wound care left hip.  Refill on his oxycodone and muscle relaxant given.  Will see him in 1 month reevaluate his work status at that time.  Questions were encouraged and answered at length.

## 2023-02-28 ENCOUNTER — Other Ambulatory Visit: Payer: Self-pay | Admitting: Orthopaedic Surgery

## 2023-02-28 MED ORDER — OXYCODONE HCL 5 MG PO TABS: 5.0000 mg | ORAL_TABLET | Freq: Three times a day (TID) | ORAL | 0 refills | Status: DC | PRN

## 2023-03-07 ENCOUNTER — Other Ambulatory Visit: Payer: Self-pay | Admitting: Orthopaedic Surgery

## 2023-03-17 ENCOUNTER — Other Ambulatory Visit: Payer: Self-pay | Admitting: Orthopaedic Surgery

## 2023-03-17 MED ORDER — OXYCODONE HCL 5 MG PO TABS: 5.0000 mg | ORAL_TABLET | Freq: Three times a day (TID) | ORAL | 0 refills | Status: DC | PRN

## 2023-03-22 ENCOUNTER — Encounter: Payer: Self-pay | Admitting: Physician Assistant

## 2023-03-22 ENCOUNTER — Ambulatory Visit (INDEPENDENT_AMBULATORY_CARE_PROVIDER_SITE_OTHER): Payer: 59 | Admitting: Physician Assistant

## 2023-03-22 DIAGNOSIS — Z96642 Presence of left artificial hip joint: Secondary | ICD-10-CM

## 2023-03-22 NOTE — Progress Notes (Signed)
HPI: [name] [name] returns today follow-up of his left total hip arthroplasty.  He states he still has pain in the hip but is improving.  His pain he ranks to be 4 out of 10.  He takes occasional oxycodone and continues to take muscle relaxer occasionally.  He feels that his incision area of dehiscence is almost completely closed.  He has had no drainage.  Denies any fevers or chills.  Physical exam: General well-developed well-nourished male who ambulates without any assistive device. Physical exam: Left hip surgical incision area of dehiscence is 0.5 x 1 cm in length.  There is good granulation.  No signs of infection or malodor.  The remainder of the incisions well-healed.  Left hip good range of motion without pain.  Impression: Status post left total hip arthroplasty  Plan: He will continue to apply mupirocin after washing the area of dehiscence daily.  He is to keep this area clean and dry.  Will have him return to work full duties as of August 25.  Did advise him to cover the area with gauze and tape while at work.  He will follow-up with [place] in 1 month sooner if there is any questions or concerns.

## 2023-04-04 ENCOUNTER — Other Ambulatory Visit: Payer: Self-pay | Admitting: Orthopaedic Surgery

## 2023-04-04 MED ORDER — OXYCODONE HCL 5 MG PO TABS: 5.0000 mg | ORAL_TABLET | Freq: Three times a day (TID) | ORAL | 0 refills | Status: AC | PRN

## 2023-05-02 ENCOUNTER — Other Ambulatory Visit: Payer: Self-pay | Admitting: Orthopaedic Surgery

## 2023-06-15 ENCOUNTER — Other Ambulatory Visit: Payer: Self-pay | Admitting: Orthopaedic Surgery

## 2023-08-17 ENCOUNTER — Other Ambulatory Visit: Payer: Self-pay | Admitting: Orthopaedic Surgery

## 2023-09-28 ENCOUNTER — Encounter (HOSPITAL_COMMUNITY): Payer: Self-pay | Admitting: *Deleted

## 2023-09-28 ENCOUNTER — Emergency Department (HOSPITAL_COMMUNITY)
Admission: EM | Admit: 2023-09-28 | Discharge: 2023-09-28 | Disposition: A | Discharge status: Home / Self Care | Payer: Worker's Compensation | Attending: Emergency Medicine | Admitting: Emergency Medicine

## 2023-09-28 ENCOUNTER — Other Ambulatory Visit: Payer: Self-pay

## 2023-09-28 DIAGNOSIS — J069 Acute upper respiratory infection, unspecified: Secondary | ICD-10-CM | POA: Insufficient documentation

## 2023-09-28 DIAGNOSIS — M436 Torticollis: Secondary | ICD-10-CM

## 2023-09-28 DIAGNOSIS — M542 Cervicalgia: Secondary | ICD-10-CM | POA: Insufficient documentation

## 2023-09-28 DIAGNOSIS — Z7982 Long term (current) use of aspirin: Secondary | ICD-10-CM | POA: Diagnosis not present

## 2023-09-28 DIAGNOSIS — R519 Headache, unspecified: Secondary | ICD-10-CM | POA: Diagnosis present

## 2023-09-28 LAB — BASIC METABOLIC PANEL
Anion gap: 9 (ref 5–15)
BUN: 17 mg/dL (ref 6–20)
CO2: 26 mmol/L (ref 22–32)
Calcium: 9 mg/dL (ref 8.9–10.3)
Chloride: 101 mmol/L (ref 98–111)
Creatinine, Ser: 0.9 mg/dL (ref 0.61–1.24)
GFR, Estimated: >60 mL/min (ref >60)
Glucose, Bld: 102 mg/dL — ABNORMAL HIGH (ref 70–99)
Potassium: 4.3 mmol/L (ref 3.5–5.1)
Sodium: 136 mmol/L (ref 135–145)

## 2023-09-28 LAB — CBC
HCT: 53.1 % — ABNORMAL HIGH (ref 39.0–52.0)
Hemoglobin: 17.5 g/dL — ABNORMAL HIGH (ref 13.0–17.0)
MCH: 33.3 pg (ref 26.0–34.0)
MCHC: 33 g/dL (ref 30.0–36.0)
MCV: 101.1 fL — ABNORMAL HIGH (ref 80.0–100.0)
Platelets: 148 10*3/uL — ABNORMAL LOW (ref 150–400)
RBC: 5.25 MIL/uL (ref 4.22–5.81)
RDW: 14.6 % (ref 11.5–15.5)
WBC: 8.7 10*3/uL (ref 4.0–10.5)
nRBC: 0 % (ref 0.0–0.2)

## 2023-09-28 LAB — RESP PANEL BY RT-PCR (RSV, FLU A&B, COVID)  RVPGX2
Influenza A by PCR: NEGATIVE
Influenza B by PCR: NEGATIVE
Resp Syncytial Virus by PCR: NEGATIVE
SARS Coronavirus 2 by RT PCR: NEGATIVE

## 2023-09-28 NOTE — ED Provider Notes (Signed)
[place] EMERGENCY DEPARTMENT AT [facility] Provider Note   CSN: [id number] Arrival date & time: 09/28/23  0915     History  Chief Complaint  Patient presents with   Headache    [name] [name] is a 58 y.o. male.  58 yo M with a cc of cough congestion, going on since yesterday.  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  Per the patient it sounds like both lumbar puncture studies were either negative or equivocal but there was some ongoing concern that the patient still had meningitis and was being treated.  He has been followed at his employee health system was seen a couple days ago and had no symptoms and was told to watch himself.  Developed what he calls just a pressure in his head.  Tells me that it is quite mild and does not really even notice it.  He also felt like his neck had gotten a little bit more stiff over the past 24 to 48 hours.  He went back to employee health today and they encouraged him to come to the ED for evaluation.  He denies one-sided numbness or weakness or difficulty speech or swallowing.  Has been coughing a bit here recently as well.   Headache      Home Medications Prior to Admission medications   Medication Sig Start Date End Date Taking? Authorizing Provider  albuterol (PROVENTIL HFA;VENTOLIN HFA) 108 (90 BASE) MCG/ACT inhaler Inhale 1-2 puffs into the lungs every 6 (six) hours as needed for wheezing or shortness of breath.    [provider]  aspirin 81 MG chewable tablet Chew 1 tablet (81 mg total) by mouth 2 (two) times daily. Patient taking differently: Chew 162 mg by mouth daily. 08/08/18   Kathryne Hitch, MD  carvedilol (COREG) 12.5 MG tablet Take 12.5 mg by mouth 2 (two) times daily. 07/16/18   [provider]  celecoxib (CELEBREX) 200 MG capsule TAKE 1 CAPSULE (200 MG TOTAL) BY MOUTH 2 (TWO) TIMES DAILY BETWEEN MEALS AS  NEEDED. 08/17/23   Kathryne Hitch, MD  citalopram (CELEXA) 40 MG tablet Take 40 mg by mouth every evening. 07/18/18   [provider]  doxycycline (VIBRA-TABS) 100 MG tablet Take 1 tablet (100 mg total) by mouth 2 (two) times daily. 01/19/23   Kathryne Hitch, MD  escitalopram (LEXAPRO) 10 MG tablet Take 10 mg by mouth daily.    [provider]  ezetimibe (ZETIA) 10 MG tablet Take 10 mg by mouth daily.    [provider]  hydrochlorothiazide (HYDRODIURIL) 25 MG tablet Take 25 mg by mouth daily. 07/18/18   [provider]  JARDIANCE 25 MG TABS tablet Take 25 mg by mouth daily.    [provider]  MELATONIN PO Take 40 mg by mouth at bedtime.    [provider]  metFORMIN (GLUCOPHAGE-XR) 500 MG 24 hr tablet Take 1,000 mg by mouth 2 (two) times daily. 07/10/18   [provider]  montelukast (SINGULAIR) 10 MG tablet Take 10 mg by mouth daily.     [provider]  mupirocin cream (BACTROBAN) 2 % Apply 1 Application topically daily. Apply to hip wound daily. 01/19/23   Kathryne Hitch, MD  oxyCODONE (OXY IR/ROXICODONE) 5 MG immediate release tablet Take 1-2 tablets (5-10 mg total) by mouth 3 (three) times daily as needed for moderate pain (pain score 4-6). 04/04/23  Kathryne Hitch, MD  pioglitazone (ACTOS) 15 MG tablet Take 15 mg by mouth daily. 10/13/22   [provider]  tiZANidine (ZANAFLEX) 4 MG tablet Take 1 tablet (4 mg total) by mouth every 6 (six) hours as needed for muscle spasms. 02/20/23   Kirtland Bouchard, PA-C  zolpidem (AMBIEN) 10 MG tablet Take 10 mg by mouth at bedtime as needed for sleep.    [provider]      Allergies    Ace inhibitors, Simvastatin, and Codeine    Review of Systems   Review of Systems  Neurological:  Positive for headaches.    Physical Exam Updated Vital Signs BP (!) 157/91 (BP Location: Left Arm)   Pulse 77   Temp 98.7 F (37.1 C) (Oral)    Resp 18   Wt (!) 155.6 kg   SpO2 93%   BMI 44.04 kg/m  Physical Exam Vitals and nursing note reviewed.  Constitutional:      Appearance: He is well-developed.     Comments: BMI 44  HENT:     Head: Normocephalic and atraumatic.  Eyes:     Pupils: Pupils are equal, round, and reactive to light.  Neck:     Vascular: No JVD.  Cardiovascular:     Rate and Rhythm: Normal rate and regular rhythm.     Heart sounds: No murmur heard.    No friction rub. No gallop.  Pulmonary:     Effort: No respiratory distress.     Breath sounds: No wheezing.  Abdominal:     General: There is no distension.     Tenderness: There is no abdominal tenderness. There is no guarding or rebound.  Musculoskeletal:        General: Normal range of motion.     Cervical back: Normal range of motion and neck supple.  Skin:    Coloration: Skin is not pale.     Findings: No rash.  Neurological:     Mental Status: He is alert and oriented to person, place, and time.  Psychiatric:        Behavior: Behavior normal.     ED Results / Procedures / Treatments   Labs (all labs ordered are listed, but only abnormal results are displayed) Labs Reviewed  RESP PANEL BY RT-PCR (RSV, FLU A&B, COVID)  [id number]  CBC  BASIC METABOLIC PANEL    EKG None  Radiology No results found.  Procedures Procedures    Medications Ordered in ED Medications - No data to display  ED Course/ Medical Decision Making/ A&P                                 Medical Decision Making  58 yo M with a cc of a headache and neck stiffness after being exposed to a patient that might have meningitis.  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  Per the patient's understanding it sounds like the CSF studies were either negative or equivocal but it sounded like there was some ongoing concern that the patient still had meningitis and required hospitalization.   He has been followed by employee health.  Had no symptoms initially and since then has developed a fullness in his head as well as some neck stiffness.  This been going on for about 24 to 48 hours.  It is getting mildly worse.  He has also had a bit of a cough.  He has a benign neurologic exam here.  He has no obvious meningeal signs.  He does have some chronic back stiffness and so difficult to obtain a Kernig's however his Brudzinski's is negative and he has a negative jolt test.  I did discuss risk and benefits of lumbar puncture with the patient.  At this point I think it is quite unlikely that he has meningitis and based at least on his history I am not certain that the patient he took care of also has bacterial meningitis.  Patient is declining lumbar puncture at this time.  Will continue to observe his symptoms.  I am also a bit surprised if the patient that he transported did have meningitis why was he not started on antibiotic prophylaxis.  That makes me also suspect that the test thus far been negative.  Patient did have blood work drawn through the triage process as well as the COVID flu and RSV test.  The patient feels well enough to go home.  Will check his tests on MyChart.  10:58 AM:  I have discussed the diagnosis/risks/treatment options with the patient.  Evaluation and diagnostic testing in the emergency department does not suggest an emergent condition requiring admission or immediate intervention beyond what has been performed at this time.  They will follow up with PCP. We also discussed returning to the ED immediately if new or worsening sx occur. We discussed the sx which are most concerning (e.g., sudden worsening pain, fever, inability to tolerate by mouth) that necessitate immediate return. Medications administered to the patient during their visit and any new prescriptions provided to the patient are listed below.  Medications given during this visit Medications - No data to  display   The patient appears reasonably screen and/or stabilized for discharge and I doubt any other medical condition or other [facility] requiring further screening, evaluation, or treatment in the ED at this time prior to discharge.          Final Clinical Impression(s) / ED Diagnoses Final diagnoses:  Neck stiffness  Viral URI with cough    Rx / DC Orders ED Discharge Orders     None         Melene Plan, DO 09/28/23 1058

## 2023-09-28 NOTE — ED Triage Notes (Signed)
[employer] employee with recent exposure to meningitis last week. Symptomatic pt was reportedly questionable bacterial/ viral- inconclusive. Seen at [facility] employee health and was fine w/o sx on Monday. Yesterday became symptomatic. This am went back to employee health and was instructed to come to ED. C/o HA, stiff neck, chills and severe fatigue. Some sob. Alert, NAD, calm, interactive, resps e/u, speaking in clear complete sentences. No meningisimus noted. No obvious photo sensitivity. Steady gait.

## 2023-10-17 ENCOUNTER — Other Ambulatory Visit: Payer: Self-pay | Admitting: Orthopaedic Surgery

## 2023-10-20 ENCOUNTER — Other Ambulatory Visit: Payer: Self-pay | Admitting: Orthopaedic Surgery

## 2023-12-16 ENCOUNTER — Other Ambulatory Visit: Payer: Self-pay | Admitting: Orthopaedic Surgery

## 2024-02-18 ENCOUNTER — Other Ambulatory Visit: Payer: Self-pay | Admitting: Orthopaedic Surgery

## 2024-05-19 ENCOUNTER — Other Ambulatory Visit: Payer: Self-pay | Admitting: Orthopaedic Surgery

## 2024-06-17 ENCOUNTER — Encounter: Payer: Self-pay | Admitting: Radiology

## 2024-07-20 ENCOUNTER — Other Ambulatory Visit: Payer: Self-pay | Admitting: Orthopaedic Surgery
